# Patient Record
Sex: Female | Born: 1970
Health system: Southern US, Community
[De-identification: ages and names within clinical notes are randomized; demographics above are authoritative.]

## PROBLEM LIST (undated history)

## (undated) DIAGNOSIS — E079 Disorder of thyroid, unspecified: Secondary | ICD-10-CM

## (undated) HISTORY — PX: APPENDECTOMY: SHX54

## (undated) HISTORY — PX: MOUTH SURGERY: SHX715

## (undated) HISTORY — PX: TUBAL LIGATION: SHX77

---

## 2000-06-18 ENCOUNTER — Encounter: Payer: Self-pay | Admitting: Orthopaedic Surgery

## 2000-06-18 ENCOUNTER — Ambulatory Visit (HOSPITAL_COMMUNITY): Admission: RE | Admit: 2000-06-18 | Discharge: 2000-06-18 | Payer: Self-pay | Admitting: Orthopaedic Surgery

## 2002-11-23 ENCOUNTER — Emergency Department (HOSPITAL_COMMUNITY): Admission: EM | Admit: 2002-11-23 | Discharge: 2002-11-24 | Payer: Self-pay | Admitting: Emergency Medicine

## 2002-11-25 ENCOUNTER — Encounter: Payer: Self-pay | Admitting: Preventative Medicine

## 2002-11-25 ENCOUNTER — Ambulatory Visit (HOSPITAL_COMMUNITY): Admission: RE | Admit: 2002-11-25 | Discharge: 2002-11-25 | Payer: Self-pay | Admitting: Preventative Medicine

## 2011-04-21 ENCOUNTER — Encounter (HOSPITAL_COMMUNITY): Payer: Self-pay

## 2011-04-21 ENCOUNTER — Encounter (HOSPITAL_COMMUNITY): Payer: Self-pay | Admitting: *Deleted

## 2011-04-21 ENCOUNTER — Emergency Department (HOSPITAL_COMMUNITY): Payer: Self-pay

## 2011-04-21 ENCOUNTER — Encounter (HOSPITAL_COMMUNITY): Payer: Self-pay | Admitting: Anesthesiology

## 2011-04-21 ENCOUNTER — Encounter (HOSPITAL_COMMUNITY)
Admission: EM | Disposition: A | Discharge status: Home / Self Care | Payer: Self-pay | Source: Home / Self Care | Attending: Emergency Medicine

## 2011-04-21 ENCOUNTER — Emergency Department (HOSPITAL_COMMUNITY): Payer: Self-pay | Admitting: Anesthesiology

## 2011-04-21 ENCOUNTER — Observation Stay (HOSPITAL_COMMUNITY)
Admission: EM | Admit: 2011-04-21 | Discharge: 2011-04-22 | Disposition: A | Discharge status: Home / Self Care | Payer: Self-pay | Attending: Emergency Medicine | Admitting: Emergency Medicine

## 2011-04-21 DIAGNOSIS — K37 Unspecified appendicitis: Secondary | ICD-10-CM

## 2011-04-21 DIAGNOSIS — K3580 Unspecified acute appendicitis: Principal | ICD-10-CM | POA: Insufficient documentation

## 2011-04-21 HISTORY — DX: Disorder of thyroid, unspecified: E07.9

## 2011-04-21 HISTORY — PX: LAPAROSCOPIC APPENDECTOMY: SHX408

## 2011-04-21 LAB — DIFFERENTIAL
Basophils Absolute: 0 10*3/uL (ref 0.0–0.1)
Basophils Relative: 0 % (ref 0–1)
Eosinophils Absolute: 0.1 10*3/uL (ref 0.0–0.7)
Eosinophils Relative: 0 % (ref 0–5)
Lymphocytes Relative: 16 % (ref 12–46)
Lymphs Abs: 2.7 10*3/uL (ref 0.7–4.0)
Monocytes Absolute: 0.8 10*3/uL (ref 0.1–1.0)
Monocytes Relative: 5 % (ref 3–12)
Neutro Abs: 13.4 10*3/uL — ABNORMAL HIGH (ref 1.7–7.7)
Neutrophils Relative %: 79 % — ABNORMAL HIGH (ref 43–77)

## 2011-04-21 LAB — BASIC METABOLIC PANEL
BUN: 7 mg/dL (ref 6–23)
CO2: 28 mEq/L (ref 19–32)
Calcium: 9.5 mg/dL (ref 8.4–10.5)
Chloride: 101 mEq/L (ref 96–112)
Creatinine, Ser: 0.88 mg/dL (ref 0.50–1.10)
GFR calc Af Amer: >90 mL/min (ref >90)
GFR calc non Af Amer: 82 mL/min — ABNORMAL LOW (ref >90)
Glucose, Bld: 104 mg/dL — ABNORMAL HIGH (ref 70–99)
Potassium: 4.2 mEq/L (ref 3.5–5.1)
Sodium: 136 mEq/L (ref 135–145)

## 2011-04-21 LAB — URINALYSIS, ROUTINE W REFLEX MICROSCOPIC
Bilirubin Urine: NEGATIVE
Glucose, UA: NEGATIVE mg/dL
Ketones, ur: NEGATIVE mg/dL
Nitrite: POSITIVE — AB
Protein, ur: NEGATIVE mg/dL
Specific Gravity, Urine: 1.025 (ref 1.005–1.030)
Urobilinogen, UA: 1 mg/dL (ref 0.0–1.0)
pH: 6.5 (ref 5.0–8.0)

## 2011-04-21 LAB — CBC
HCT: 39 % (ref 36.0–46.0)
Hemoglobin: 12.9 g/dL (ref 12.0–15.0)
MCH: 27.2 pg (ref 26.0–34.0)
MCHC: 33.1 g/dL (ref 30.0–36.0)
MCV: 82.1 fL (ref 78.0–100.0)
Platelets: 379 10*3/uL (ref 150–400)
RBC: 4.75 MIL/uL (ref 3.87–5.11)
RDW: 14.9 % (ref 11.5–15.5)
WBC: 17 10*3/uL — ABNORMAL HIGH (ref 4.0–10.5)

## 2011-04-21 LAB — URINE MICROSCOPIC-ADD ON

## 2011-04-21 LAB — PREGNANCY, URINE: Preg Test, Ur: NEGATIVE

## 2011-04-21 SURGERY — APPENDECTOMY, LAPAROSCOPIC
Anesthesia: General | Wound class: Contaminated

## 2011-04-21 MED ORDER — ERTAPENEM SODIUM 1 G IJ SOLR: 1.0000 g | INTRAMUSCULAR | Status: DC

## 2011-04-21 MED ORDER — ONDANSETRON HCL 4 MG PO TABS: 4.0000 mg | ORAL_TABLET | Freq: Four times a day (QID) | ORAL | Status: DC | PRN

## 2011-04-21 MED ORDER — BUPIVACAINE HCL (PF) 0.5 % IJ SOLN
INTRAMUSCULAR | Status: DC | PRN
  Administered 2011-04-21: 10 mL

## 2011-04-21 MED ORDER — ONDANSETRON HCL 4 MG/2ML IJ SOLN
INTRAMUSCULAR | Status: DC | PRN
  Administered 2011-04-21: 4 mg via INTRAVENOUS

## 2011-04-21 MED ORDER — ENOXAPARIN SODIUM 80 MG/0.8ML ~~LOC~~ SOLN
40.0000 mg | Freq: Once | SUBCUTANEOUS | Status: AC
  Administered 2011-04-21: 40 mg via SUBCUTANEOUS
  Filled 2011-04-21: qty 0.8

## 2011-04-21 MED ORDER — METOCLOPRAMIDE HCL 5 MG/ML IJ SOLN
10.0000 mg | Freq: Once | INTRAMUSCULAR | Status: AC
  Administered 2011-04-21: 10 mg via INTRAVENOUS
  Filled 2011-04-21: qty 2

## 2011-04-21 MED ORDER — GLYCOPYRROLATE 0.2 MG/ML IJ SOLN
INTRAMUSCULAR | Status: DC | PRN
  Administered 2011-04-21: .6 mg via INTRAVENOUS

## 2011-04-21 MED ORDER — HYDROMORPHONE HCL PF 1 MG/ML IJ SOLN
1.0000 mg | Freq: Once | INTRAMUSCULAR | Status: AC
  Administered 2011-04-21: 1 mg via INTRAVENOUS
  Filled 2011-04-21: qty 1

## 2011-04-21 MED ORDER — IOHEXOL 300 MG/ML  SOLN
100.0000 mL | Freq: Once | INTRAMUSCULAR | Status: AC | PRN
  Administered 2011-04-21: 100 mL via INTRAVENOUS

## 2011-04-21 MED ORDER — ONDANSETRON HCL 4 MG/2ML IJ SOLN: 4.0000 mg | Freq: Four times a day (QID) | INTRAMUSCULAR | Status: DC | PRN

## 2011-04-21 MED ORDER — FENTANYL CITRATE 0.05 MG/ML IJ SOLN
50.0000 ug | Freq: Once | INTRAMUSCULAR | Status: AC
  Administered 2011-04-21: 50 ug via INTRAVENOUS
  Filled 2011-04-21: qty 2

## 2011-04-21 MED ORDER — FENTANYL CITRATE 0.05 MG/ML IJ SOLN
100.0000 ug | Freq: Once | INTRAMUSCULAR | Status: AC
  Administered 2011-04-21: 100 ug via INTRAVENOUS
  Filled 2011-04-21: qty 2

## 2011-04-21 MED ORDER — IOHEXOL 300 MG/ML  SOLN
40.0000 mL | Freq: Once | INTRAMUSCULAR | Status: AC | PRN
  Administered 2011-04-21: 40 mL via ORAL

## 2011-04-21 MED ORDER — MIDAZOLAM HCL 5 MG/5ML IJ SOLN
INTRAMUSCULAR | Status: DC | PRN
  Administered 2011-04-21: 2 mg via INTRAVENOUS

## 2011-04-21 MED ORDER — ONDANSETRON HCL 4 MG/2ML IJ SOLN
4.0000 mg | Freq: Once | INTRAMUSCULAR | Status: AC
  Administered 2011-04-21: 4 mg via INTRAVENOUS
  Filled 2011-04-21: qty 2

## 2011-04-21 MED ORDER — ONDANSETRON HCL 4 MG/2ML IJ SOLN: 4.0000 mg | Freq: Once | INTRAMUSCULAR | Status: AC | PRN

## 2011-04-21 MED ORDER — LACTATED RINGERS IV SOLN
INTRAVENOUS | Status: DC
  Administered 2011-04-22: 02:00:00 via INTRAVENOUS

## 2011-04-21 MED ORDER — PROPOFOL 10 MG/ML IV BOLUS
INTRAVENOUS | Status: DC | PRN
  Administered 2011-04-21: 150 mg via INTRAVENOUS

## 2011-04-21 MED ORDER — SODIUM CHLORIDE 0.9 % IR SOLN
Status: DC | PRN
  Administered 2011-04-21: 1000 mL

## 2011-04-21 MED ORDER — ENOXAPARIN SODIUM 40 MG/0.4ML ~~LOC~~ SOLN: 40.0000 mg | SUBCUTANEOUS | Status: DC

## 2011-04-21 MED ORDER — HYDROMORPHONE HCL PF 1 MG/ML IJ SOLN
1.0000 mg | INTRAMUSCULAR | Status: DC | PRN
  Administered 2011-04-21 – 2011-04-22 (×2): 1 mg via INTRAVENOUS
  Filled 2011-04-21 (×2): qty 1

## 2011-04-21 MED ORDER — ACETAMINOPHEN 10 MG/ML IV SOLN
1000.0000 mg | Freq: Once | INTRAVENOUS | Status: AC
  Administered 2011-04-21: 1000 mg via INTRAVENOUS

## 2011-04-21 MED ORDER — SODIUM CHLORIDE 0.9 % IV SOLN: INTRAVENOUS | Status: DC

## 2011-04-21 MED ORDER — LEVOTHYROXINE SODIUM 112 MCG PO TABS
112.0000 ug | ORAL_TABLET | Freq: Every day | ORAL | Status: DC
  Filled 2011-04-21 (×2): qty 1

## 2011-04-21 MED ORDER — LIDOCAINE HCL 1 % IJ SOLN
INTRAMUSCULAR | Status: DC | PRN
  Administered 2011-04-21: 40 mg via INTRADERMAL

## 2011-04-21 MED ORDER — NEOSTIGMINE METHYLSULFATE 1 MG/ML IJ SOLN
INTRAMUSCULAR | Status: DC | PRN
  Administered 2011-04-21: 3 mg via INTRAVENOUS

## 2011-04-21 MED ORDER — SUCCINYLCHOLINE CHLORIDE 20 MG/ML IJ SOLN
INTRAMUSCULAR | Status: DC | PRN
  Administered 2011-04-21: 120 mg via INTRAVENOUS

## 2011-04-21 MED ORDER — SODIUM CHLORIDE 0.9 % IV BOLUS (SEPSIS)
1000.0000 mL | Freq: Once | INTRAVENOUS | Status: AC
  Administered 2011-04-21: 1000 mL via INTRAVENOUS

## 2011-04-21 MED ORDER — ROCURONIUM BROMIDE 100 MG/10ML IV SOLN
INTRAVENOUS | Status: DC | PRN
  Administered 2011-04-21: 25 mg via INTRAVENOUS
  Administered 2011-04-21: 5 mg via INTRAVENOUS

## 2011-04-21 MED ORDER — FENTANYL CITRATE 0.05 MG/ML IJ SOLN
INTRAMUSCULAR | Status: DC | PRN
  Administered 2011-04-21 (×4): 50 ug via INTRAVENOUS

## 2011-04-21 MED ORDER — FENTANYL CITRATE 0.05 MG/ML IJ SOLN
1.0000 ug/kg | INTRAMUSCULAR | Status: DC | PRN
  Administered 2011-04-21 (×2): 50 ug via INTRAVENOUS

## 2011-04-21 MED ORDER — ACETAMINOPHEN 10 MG/ML IV SOLN
1000.0000 mg | Freq: Four times a day (QID) | INTRAVENOUS | Status: DC
  Administered 2011-04-22: 1000 mg via INTRAVENOUS
  Filled 2011-04-21 (×3): qty 100

## 2011-04-21 MED ORDER — LACTATED RINGERS IV SOLN
INTRAVENOUS | Status: DC | PRN
  Administered 2011-04-21: 20:00:00 via INTRAVENOUS

## 2011-04-21 MED ORDER — SODIUM CHLORIDE 0.9 % IV SOLN
1.0000 g | Freq: Once | INTRAVENOUS | Status: AC
  Administered 2011-04-21: 1 g via INTRAVENOUS
  Filled 2011-04-21: qty 1

## 2011-04-21 SURGICAL SUPPLY — 44 items
BAG HAMPER (MISCELLANEOUS) ×2 IMPLANT
CLOTH BEACON ORANGE TIMEOUT ST (SAFETY) ×2 IMPLANT
COVER LIGHT HANDLE STERIS (MISCELLANEOUS) ×4 IMPLANT
CUTTER ENDO LINEAR 45M (STAPLE) IMPLANT
CUTTER LINEAR ENDO 35 ETS (STAPLE) IMPLANT
CUTTER LINEAR ENDO 35 ETS TH (STAPLE) ×2 IMPLANT
DECANTER SPIKE VIAL GLASS SM (MISCELLANEOUS) ×2 IMPLANT
DISSECTOR BLUNT TIP ENDO 5MM (MISCELLANEOUS) IMPLANT
DURAPREP 26ML APPLICATOR (WOUND CARE) ×2 IMPLANT
ELECT REM PT RETURN 9FT ADLT (ELECTROSURGICAL) ×2
ELECTRODE REM PT RTRN 9FT ADLT (ELECTROSURGICAL) ×1 IMPLANT
FILTER SMOKE EVAC LAPAROSHD (FILTER) ×2 IMPLANT
FORMALIN 10 PREFIL 120ML (MISCELLANEOUS) ×2 IMPLANT
GLOVE BIO SURGEON STRL SZ7.5 (GLOVE) ×2 IMPLANT
GLOVE BIOGEL PI IND STRL 7.0 (GLOVE) ×1 IMPLANT
GLOVE BIOGEL PI INDICATOR 7.0 (GLOVE) ×1
GLOVE ECLIPSE 6.5 STRL STRAW (GLOVE) ×2 IMPLANT
GLOVE ECLIPSE 7.0 STRL STRAW (GLOVE) ×2 IMPLANT
GLOVE EXAM NITRILE MD LF STRL (GLOVE) ×2 IMPLANT
GOWN STRL REIN XL XLG (GOWN DISPOSABLE) ×4 IMPLANT
INST SET LAPROSCOPIC AP (KITS) ×2 IMPLANT
IV NS IRRIG 3000ML ARTHROMATIC (IV SOLUTION) ×2 IMPLANT
KIT ROOM TURNOVER APOR (KITS) ×2 IMPLANT
MANIFOLD NEPTUNE II (INSTRUMENTS) ×2 IMPLANT
NEEDLE INSUFFLATION 14GA 120MM (NEEDLE) ×2 IMPLANT
NS IRRIG 1000ML POUR BTL (IV SOLUTION) ×2 IMPLANT
PACK LAP CHOLE LZT030E (CUSTOM PROCEDURE TRAY) ×2 IMPLANT
PAD ARMBOARD 7.5X6 YLW CONV (MISCELLANEOUS) ×2 IMPLANT
POUCH SPECIMEN RETRIEVAL 10MM (ENDOMECHANICALS) ×2 IMPLANT
RELOAD /EVU35 (ENDOMECHANICALS) IMPLANT
RELOAD 45 VASCULAR/THIN (ENDOMECHANICALS) IMPLANT
RELOAD CUTTER ETS 35MM STAND (ENDOMECHANICALS) IMPLANT
SCALPEL HARMONIC ACE (MISCELLANEOUS) ×2 IMPLANT
SET BASIN LINEN APH (SET/KITS/TRAYS/PACK) ×2 IMPLANT
SET TUBE IRRIG SUCTION NO TIP (IRRIGATION / IRRIGATOR) ×2 IMPLANT
SPONGE GAUZE 2X2 8PLY STRL LF (GAUZE/BANDAGES/DRESSINGS) ×6 IMPLANT
STAPLER VISISTAT (STAPLE) ×2 IMPLANT
SUT VICRYL 0 UR6 27IN ABS (SUTURE) ×2 IMPLANT
TRAY FOLEY CATH 14FR (SET/KITS/TRAYS/PACK) ×2 IMPLANT
TROCAR Z-THAD FIOS HNDL 12X100 (TROCAR) ×2 IMPLANT
TROCAR Z-THRD FIOS HNDL 11X100 (TROCAR) ×2 IMPLANT
TROCAR Z-THREAD FIOS 5X100MM (TROCAR) ×2 IMPLANT
WARMER LAPAROSCOPE (MISCELLANEOUS) ×2 IMPLANT
YANKAUER SUCT BULB TIP 10FT TU (MISCELLANEOUS) ×2 IMPLANT

## 2011-04-21 NOTE — ED Provider Notes (Signed)
History     CSN: [id number]  Arrival date & time 04/21/11  3086   First MD Initiated Contact with Patient 04/21/11 1324      Chief Complaint  Patient presents with  . Abdominal Pain    (Consider location/radiation/quality/duration/timing/severity/associated sxs/prior treatment) HPI 41 year old female has a gradual onset constant over 24 hours of localized right lower quadrant pain with nausea and one episode of nonbloody vomiting with persistent nausea and anorexia since then, she only had a small amount to drink earlier today and it caused worse pain, she has not eaten solid foods since yesterday, she is no diarrhea no bloody stools no vaginal discharge no vaginal bleeding no dysuria. She is no fever cough chest pain or shortness of breath. Her pain is moderately severe and worse with position changes better she stays still. She gradual onset constant pain without treatment prior to arrival. Past Medical History  Diagnosis Date  . Thyroid disease     Past Surgical History  Procedure Date  . Mouth surgery   . Tubal ligation   . Appendectomy     History reviewed. No pertinent family history.  History  Substance Use Topics  . Smoking status: Former Games developer  . Smokeless tobacco: Not on file  . Alcohol Use: Yes     Occ    OB History    Grav Para Term Preterm Abortions TAB SAB Ect Mult Living                  Review of Systems  Constitutional: Negative for fever.       10 Systems reviewed and are negative for acute change except as noted in the HPI.  HENT: Negative for congestion.   Eyes: Negative for discharge and redness.  Respiratory: Negative for cough and shortness of breath.   Cardiovascular: Negative for chest pain.  Gastrointestinal: Positive for nausea, vomiting and abdominal pain. Negative for diarrhea.  Genitourinary: Negative for dysuria, flank pain, vaginal bleeding and vaginal discharge.  Musculoskeletal: Negative for back pain.  Skin: Negative for rash.   Neurological: Negative for syncope, numbness and headaches.  Psychiatric/Behavioral:       No behavior change.    Allergies  Kiwi extract; Tomato; Vicodin; and Pineapple  Home Medications   Current Outpatient Rx  Name Route Sig Dispense Refill  . AMOXICILLIN 500 MG PO CAPS Oral Take 500 mg by mouth 3 (three) times daily.    Marland Kitchen LEVOTHYROXINE SODIUM 112 MCG PO TABS Oral Take 112 mcg by mouth daily.    . OXYCODONE-ACETAMINOPHEN 7.5-325 MG PO TABS Oral Take 1-2 tablets by mouth every 4 (four) hours as needed for pain. 40 tablet 0    BP 107/67  Pulse 67  Temp(Src) 98.1 F (36.7 C) (Oral)  Resp 18  Ht 5\' 5"  (1.651 m)  Wt 188 lb 7.9 oz (85.5 kg)  BMI 31.37 kg/m2  SpO2 96%  LMP 04/14/2011  Physical Exam  Nursing note and vitals reviewed. Constitutional:       Awake, alert, nontoxic appearance.  HENT:  Head: Atraumatic.  Mouth/Throat: No oropharyngeal exudate.  Eyes: Pupils are equal, round, and reactive to light. Right eye exhibits no discharge. Left eye exhibits no discharge.  Neck: Neck supple.  Cardiovascular: Normal rate and regular rhythm.   No murmur heard. Pulmonary/Chest: Effort normal and breath sounds normal. No respiratory distress. She has no wheezes. She has no rales. She exhibits no tenderness.  Abdominal: Soft. Bowel sounds are normal. She exhibits no mass. There is  tenderness. There is rebound and guarding.       Moderate tenderness to the right lower quadrant with Percussion tenderness is present over the right lower quadrant with mild localized rebound in the right lower quadrant only without generalized peritonitis  Genitourinary:       A chaperone is present for a bimanual examination with no cervical motion tenderness no adnexal tenderness no adnexal masses palpated and no discharge or bleeding noted in the examination glove  Musculoskeletal: She exhibits no edema and no tenderness.       Baseline ROM, no obvious new focal weakness.  Neurological:        Mental status and motor strength appears baseline for patient and situation.  Skin: No rash noted.  Psychiatric: She has a normal mood and affect.    ED Course  Procedures (including critical care time) HCG pnd, CT ordered if HCG is neg. Patient / Family / Caregiver understand and agree with initial ED impression and plan with expectations set for ED visit.1340 Labs Reviewed  URINALYSIS, ROUTINE W REFLEX MICROSCOPIC - Abnormal; Notable for the following:    Hgb urine dipstick SMALL (*)    Nitrite POSITIVE (*)    Leukocytes, UA TRACE (*)    All other components within normal limits  BASIC METABOLIC PANEL - Abnormal; Notable for the following:    Glucose, Bld 104 (*)    GFR calc non Af Amer 82 (*)    All other components within normal limits  CBC - Abnormal; Notable for the following:    WBC 17.0 (*)    All other components within normal limits  DIFFERENTIAL - Abnormal; Notable for the following:    Neutrophils Relative 79 (*)    Neutro Abs 13.4 (*)    All other components within normal limits  URINE MICROSCOPIC-ADD ON - Abnormal; Notable for the following:    Bacteria, UA MANY (*)    All other components within normal limits  CBC - Abnormal; Notable for the following:    WBC 14.0 (*)    Hemoglobin 11.1 (*)    HCT 33.3 (*)    All other components within normal limits  PREGNANCY, URINE   Ct Abdomen Pelvis W Contrast  04/21/2011  *RADIOLOGY REPORT*  Clinical Data: Right lower quadrant pain for 2 days.  CT ABDOMEN AND PELVIS WITH CONTRAST  Technique:  Multidetector CT imaging of the abdomen and pelvis was performed following the standard protocol during bolus administration of intravenous contrast.  Contrast: OMNIPAQUE IOHEXOL 300 MG/ML IV SOLN, 40mL OMNIPAQUE IOHEXOL 300 MG/ML IV SOLN  Comparison: None.  Findings: Lung bases are clear except for minimal scarring at the left base.  No pleural or pericardial fluid.  The liver has a normal appearance without focal lesions or  biliary ductal dilatation.  No calcified gallstones.  The spleen is normal. The pancreas is normal.  The adrenal glands are normal.  The kidneys are normal.  No cyst, mass, stone or hydronephrosis.  The aorta and IVC are normal.  No retroperitoneal mass or adenopathy. No free intraperitoneal fluid or air in the upper abdominal portion of the scan.  There is acute appendicitis with a dilated fluid filled appendix. There is regional inflammatory change with a small amount of free peritoneal fluid in that region and in the pelvis.  The uterus and adnexal regions appear normal.  IMPRESSION: Acute appendicitis.  Regional inflammatory change.  Small amount of free fluid in the region and in the pelvis.  No evidence  of free air.  These results were called by telephone on 04/21/2011  at  1650 hours to  Dr. Fonnie Jarvis , who verbally acknowledged these results.  Original Report Authenticated By: Thomasenia Sales, M.D.     1. Appendicitis       MDM  Pt stable in ED with no significant deterioration in condition.Patient / Family / Caregiver informed of clinical course, understand medical decision-making process, and agree with plan.d/w surg for admit.        Hurman Horn, MD 04/22/11 2256

## 2011-04-21 NOTE — Anesthesia Preprocedure Evaluation (Addendum)
Anesthesia Evaluation  Patient identified by MRN, date of birth, ID band Patient awake    Reviewed: Allergy & Precautions, H&P , NPO status , Patient's Chart, lab work & pertinent test results  History of Anesthesia Complications Negative for: history of anesthetic complications  Airway Mallampati: I TM Distance: >3 FB Neck ROM: Full    Dental  (+) Teeth Intact and Dental Advisory Given   Pulmonary former smoker   Pulmonary exam normal       Cardiovascular neg cardio ROS Regular Normal    Neuro/Psych Negative Neurological ROS  Negative Psych ROS   GI/Hepatic   Endo/Other  Hypothyroidism   Renal/GU      Musculoskeletal   Abdominal   Peds  Hematology   Anesthesia Other Findings   Reproductive/Obstetrics                         Anesthesia Physical Anesthesia Plan  ASA: I and Emergent  Anesthesia Plan: General   Post-op Pain Management:    Induction: Intravenous, Rapid sequence and Cricoid pressure planned  Airway Management Planned: Oral ETT  Additional Equipment:   Intra-op Plan:   Post-operative Plan: Extubation in OR  Informed Consent: I have reviewed the patients History and Physical, chart, labs and discussed the procedure including the risks, benefits and alternatives for the proposed anesthesia with the patient or authorized representative who has indicated his/her understanding and acceptance.   Dental advisory given  Plan Discussed with: CRNA  Anesthesia Plan Comments: (ASA 1E as above for GOT/RSI,  Telephone consult with Dr. Jayme Cloud.)        Anesthesia Quick Evaluation

## 2011-04-21 NOTE — Anesthesia Procedure Notes (Signed)
Procedure Name: Intubation Date/Time: 04/21/2011 7:46 PM Performed by: Glynn Octave Pre-anesthesia Checklist: Patient identified, Patient being monitored, Timeout performed, Emergency Drugs available and Suction available Patient Re-evaluated:Patient Re-evaluated prior to inductionOxygen Delivery Method: Circle System Utilized Preoxygenation: Pre-oxygenation with 100% oxygen Intubation Type: IV induction, Rapid sequence and Cricoid Pressure applied Laryngoscope Size: Mac and 3 Grade View: Grade I Tube type: Oral Tube size: 7.0 mm Number of attempts: 1 Airway Equipment and Method: stylet Placement Confirmation: ETT inserted through vocal cords under direct vision,  positive ETCO2 and breath sounds checked- equal and bilateral Secured at: 21 cm Tube secured with: Tape Dental Injury: Teeth and Oropharynx as per pre-operative assessment

## 2011-04-21 NOTE — Op Note (Signed)
Patient:  Megan Stark  DOB:  [birth year]  MRN:  [id number]   Preop Diagnosis:  Acute appendicitis  Postop Diagnosis:  Same  Procedure:  Laparoscopic appendectomy  Surgeon:  Franky Macho, M.D.  Anes:  General endotracheal  Indications:  Patient is a 41 year old white female presents with a 24-hour history of worsening right lower quadrant abdominal pain. CT scan of the abdomen reveals acute appendicitis with possible early perforation. The risks and benefits of the procedure including bleeding, infection, and a possibly of an open procedure were fully explained to the patient, gave informed consent.  Procedure note:  Patient is placed the supine position. After induction of general endotracheal anesthesia, the abdomen was prepped and draped using usual sterile technique with DuraPrep. Surgical site confirmation was performed.  A supraumbilical incision was made down to the fascia. A Veress needle was introduced into the abdominal cavity and confirmation of placement was done using the saline drop test. The abdomen was then insufflated to 16 mm mercury pressure. An 11 mm trocar was introduced into the abdominal cavity under direct visualization the difficulty. The patient was placed in deeper Trendelenburg position and additional 12 mm trocar was placed the suprapubic region a 5 mm trocar was placed left lower quadrant region. The appendix was visualized and noted to be acutely inflamed. The mesoappendix was divided using the harmonic scalpel. A vascular Endo GIA was placed across the base the appendix and fired. The appendix was then removed from the operative field using an Endo Catch bag. Placenta pathology further examination. The staple line was inspected and noted within normal limits. There was some free fluid within the peritoneal cavity and pelvis, though it did not appear to be purulent in nature. It was suctioned out without difficulty. All fluid and air were then evacuated from the  abdominal cavity prior to removal of the trochars.  All wounds were gave normal saline. All wounds were injected with 0.5% Sensorcaine. The suprabuccal fashion as well suprapubic fascia reapproximated using 0 Vicryl interrupted sutures. All skin incisions were closed using staples. Betadine ointment dorsal dressings were applied.  All tape and needle counts were correct at the end of the procedure. Patient was extubated in the operating room and went back to recovery room awake in stable condition.  Complications:  None  EBL:  Minimal  Specimen:  Appendix

## 2011-04-21 NOTE — ED Notes (Signed)
RLQ abdominal pain began yesterday with nausea.

## 2011-04-21 NOTE — Anesthesia Postprocedure Evaluation (Signed)
  Anesthesia Post-op Note  Patient: Megan Stark  Procedure(s) Performed: Procedure(s) (LRB): APPENDECTOMY LAPAROSCOPIC (N/A)  Patient Location: PACU  Anesthesia Type: General  Level of Consciousness: awake and oriented  Airway and Oxygen Therapy: Patient Spontanous Breathing and Patient connected to face mask oxygen  Post-op Pain: mild  Post-op Assessment: Post-op Vital signs reviewed, Patient's Cardiovascular Status Stable, Respiratory Function Stable, Patent Airway and No signs of Nausea or vomiting  Post-op Vital Signs: Reviewed and stable  Complications: No apparent anesthesia complications

## 2011-04-21 NOTE — H&P (Signed)
Megan Stark is an 41 y.o. female.   Chief Complaint: Right lower quadrant abdominal pain HPI: Patient is a 41 year old white female presents with a 12 hour history of worsening right lower quadrant abdominal pain. CT scan the abdomen and pelvis reveals acute appendicitis. There is a question of early perforation present.  Past Medical History  Diagnosis Date  . Thyroid disease     Past Surgical History  Procedure Date  . Mouth surgery   . Tubal ligation     No family history on file. Social History:  reports that she has been smoking.  She does not have any smokeless tobacco history on file. She reports that she drinks alcohol. She reports that she does not use illicit drugs.  Allergies:  Allergies  Allergen Reactions  . Kiwi Extract Other (See Comments)    Cold Sore  . Tomato Other (See Comments)    Cold Sore  . Vicodin (Hydrocodone-Acetaminophen) Nausea And Vomiting    Medications Prior to Admission  Medication Dose Route Frequency Provider Last Rate Last Dose  . 0.9 %  sodium chloride infusion   Intravenous Continuous Hurman Horn, MD      . ertapenem [facility]) 1 g in sodium chloride 0.9 % 50 mL IVPB  1 g Intravenous Once Hurman Horn, MD   1 g at 04/21/11 1722  . fentaNYL (SUBLIMAZE) injection 100 mcg  100 mcg Intravenous Once Hurman Horn, MD   100 mcg at 04/21/11 1404  . fentaNYL (SUBLIMAZE) injection 50 mcg  50 mcg Intravenous Once Hurman Horn, MD   50 mcg at 04/21/11 1220  . HYDROmorphone (DILAUDID) injection 1 mg  1 mg Intravenous Once Hurman Horn, MD   1 mg at 04/21/11 1715  . iohexol (OMNIPAQUE) 300 MG/ML solution 100 mL  100 mL Intravenous Once PRN Medication Radiologist, MD   100 mL at 04/21/11 1625  . iohexol (OMNIPAQUE) 300 MG/ML solution 40 mL  40 mL Oral Once PRN Medication Radiologist, MD   40 mL at 04/21/11 1505  . metoCLOPramide (REGLAN) injection 10 mg  10 mg Intravenous Once Hurman Horn, MD   10 mg at 04/21/11 1715  . ondansetron (ZOFRAN)  injection 4 mg  4 mg Intravenous Once Hurman Horn, MD   4 mg at 04/21/11 1220  . ondansetron (ZOFRAN) injection 4 mg  4 mg Intravenous Once Hurman Horn, MD   4 mg at 04/21/11 1405  . sodium chloride 0.9 % bolus 1,000 mL  1,000 mL Intravenous Once Hurman Horn, MD   1,000 mL at 04/21/11 1405   No current outpatient prescriptions on file as of 04/21/2011.    Results for orders placed during the hospital encounter of 04/21/11 (from the past 48 hour(s))  URINALYSIS, ROUTINE W REFLEX MICROSCOPIC     Status: Abnormal   Collection Time   04/21/11 11:02 AM      Component Value Range Comment   Color, Urine YELLOW  YELLOW     APPearance CLEAR  CLEAR     Specific Gravity, Urine 1.025  1.005 - 1.030     pH 6.5  5.0 - 8.0     Glucose, UA NEGATIVE  NEGATIVE (mg/dL)    Hgb urine dipstick SMALL (*) NEGATIVE     Bilirubin Urine NEGATIVE  NEGATIVE     Ketones, ur NEGATIVE  NEGATIVE (mg/dL)    Protein, ur NEGATIVE  NEGATIVE (mg/dL)    Urobilinogen, UA 1.0  0.0 - 1.0 (mg/dL)  Nitrite POSITIVE (*) NEGATIVE     Leukocytes, UA TRACE (*) NEGATIVE    URINE MICROSCOPIC-ADD ON     Status: Abnormal   Collection Time   04/21/11 11:02 AM      Component Value Range Comment   WBC, UA 3-6  <3 (WBC/hpf)    RBC / HPF 3-6  <3 (RBC/hpf)    Bacteria, UA MANY (*) RARE    BASIC METABOLIC PANEL     Status: Abnormal   Collection Time   04/21/11 11:11 AM      Component Value Range Comment   Sodium 136  135 - 145 (mEq/L)    Potassium 4.2  3.5 - 5.1 (mEq/L)    Chloride 101  96 - 112 (mEq/L)    CO2 28  19 - 32 (mEq/L)    Glucose, Bld 104 (*) 70 - 99 (mg/dL)    BUN 7  6 - 23 (mg/dL)    Creatinine, Ser 1.61  0.50 - 1.10 (mg/dL)    Calcium 9.5  8.4 - 10.5 (mg/dL)    GFR calc non Af Amer 82 (*) >90 (mL/min)    GFR calc Af Amer >90  >90 (mL/min)   CBC     Status: Abnormal   Collection Time   04/21/11 11:11 AM      Component Value Range Comment   WBC 17.0 (*) 4.0 - 10.5 (K/uL)    RBC 4.75  3.87 - 5.11 (MIL/uL)     Hemoglobin 12.9  12.0 - 15.0 (g/dL)    HCT 09.6  04.5 - 40.9 (%)    MCV 82.1  78.0 - 100.0 (fL)    MCH 27.2  26.0 - 34.0 (pg)    MCHC 33.1  30.0 - 36.0 (g/dL)    RDW 81.1  91.4 - 78.2 (%)    Platelets 379  150 - 400 (K/uL)   DIFFERENTIAL     Status: Abnormal   Collection Time   04/21/11 11:11 AM      Component Value Range Comment   Neutrophils Relative 79 (*) 43 - 77 (%)    Neutro Abs 13.4 (*) 1.7 - 7.7 (K/uL)    Lymphocytes Relative 16  12 - 46 (%)    Lymphs Abs 2.7  0.7 - 4.0 (K/uL)    Monocytes Relative 5  3 - 12 (%)    Monocytes Absolute 0.8  0.1 - 1.0 (K/uL)    Eosinophils Relative 0  0 - 5 (%)    Eosinophils Absolute 0.1  0.0 - 0.7 (K/uL)    Basophils Relative 0  0 - 1 (%)    Basophils Absolute 0.0  0.0 - 0.1 (K/uL)   PREGNANCY, URINE     Status: Normal   Collection Time   04/21/11  2:10 PM      Component Value Range Comment   Preg Test, Ur NEGATIVE  NEGATIVE     Ct Abdomen Pelvis W Contrast  04/21/2011  *RADIOLOGY REPORT*  Clinical Data: Right lower quadrant pain for 2 days.  CT ABDOMEN AND PELVIS WITH CONTRAST  Technique:  Multidetector CT imaging of the abdomen and pelvis was performed following the standard protocol during bolus administration of intravenous contrast.  Contrast: OMNIPAQUE IOHEXOL 300 MG/ML IV SOLN, 40mL OMNIPAQUE IOHEXOL 300 MG/ML IV SOLN  Comparison: None.  Findings: Lung bases are clear except for minimal scarring at the left base.  No pleural or pericardial fluid.  The liver has a normal appearance without focal lesions or biliary ductal dilatation.  No calcified gallstones.  The spleen is normal. The pancreas is normal.  The adrenal glands are normal.  The kidneys are normal.  No cyst, mass, stone or hydronephrosis.  The aorta and IVC are normal.  No retroperitoneal mass or adenopathy. No free intraperitoneal fluid or air in the upper abdominal portion of the scan.  There is acute appendicitis with a dilated fluid filled appendix. There is regional  inflammatory change with a small amount of free peritoneal fluid in that region and in the pelvis.  The uterus and adnexal regions appear normal.  IMPRESSION: Acute appendicitis.  Regional inflammatory change.  Small amount of free fluid in the region and in the pelvis.  No evidence of free air.  These results were called by telephone on 04/21/2011  at  1650 hours to  Dr. Fonnie Jarvis , who verbally acknowledged these results.  Original Report Authenticated By: Thomasenia Sales, M.D.    Review of Systems  Constitutional: Positive for fever and malaise/fatigue.  HENT: Negative.   Eyes: Negative.   Respiratory: Negative.   Cardiovascular: Negative.   Gastrointestinal: Positive for abdominal pain.       Right lower quadrant.  Genitourinary: Negative.   Musculoskeletal: Negative.   Skin: Negative.   Neurological: Negative.   Endo/Heme/Allergies: Negative.   Psychiatric/Behavioral: Negative.     Blood pressure 118/66, pulse 65, temperature 98 F (36.7 C), temperature source Oral, resp. rate 22, height 5\' 5"  (1.651 m), weight 81.194 kg (179 lb), last menstrual period 04/14/2011, SpO2 100.00%. Physical Exam  Constitutional: She is oriented to person, place, and time. She appears well-developed and well-nourished.  HENT:  Head: Normocephalic and atraumatic.  Eyes: Pupils are equal, round, and reactive to light.  Neck: Normal range of motion. Neck supple.  Cardiovascular: Normal rate, regular rhythm and normal heart sounds.   Respiratory: Effort normal and breath sounds normal.  GI: Soft. Bowel sounds are normal. There is tenderness. There is rebound.       Tender over McBurney's point.  No rigidity noted.  Neurological: She is alert and oriented to person, place, and time.  Skin: Skin is warm and dry.  Psychiatric: She has a normal mood and affect.     Assessment/Plan Impression: Acute appendicitis Plan: Patient be taken to the operating room for laparoscopic appendectomy. The risks and  benefits of the procedure including bleeding, infection, and a possibly of an open procedure were fully explained to the patient, gave informed consent.  Thad Osoria A 04/21/2011, 6:31 PM

## 2011-04-21 NOTE — Transfer of Care (Signed)
Immediate Anesthesia Transfer of Care Note  Patient: Megan Stark  Procedure(s) Performed: Procedure(s) (LRB): APPENDECTOMY LAPAROSCOPIC (N/A)  Patient Location: PACU  Anesthesia Type: General  Level of Consciousness: awake, alert  and oriented  Airway & Oxygen Therapy: Patient Spontanous Breathing and Patient connected to face mask oxygen  Post-op Assessment: Report given to PACU RN  Post vital signs: Reviewed and stable  Complications: No apparent anesthesia complications

## 2011-04-22 ENCOUNTER — Encounter (HOSPITAL_COMMUNITY): Payer: Self-pay | Admitting: Anesthesiology

## 2011-04-22 LAB — CBC
HCT: 33.3 % — ABNORMAL LOW (ref 36.0–46.0)
MCH: 27.4 pg (ref 26.0–34.0)
MCV: 82.2 fL (ref 78.0–100.0)
RDW: 15 % (ref 11.5–15.5)
WBC: 14 10*3/uL — ABNORMAL HIGH (ref 4.0–10.5)

## 2011-04-22 MED ORDER — OXYCODONE-ACETAMINOPHEN 7.5-325 MG PO TABS: 1.0000 | ORAL_TABLET | ORAL | Status: AC | PRN

## 2011-04-22 MED ORDER — ACETAMINOPHEN 10 MG/ML IV SOLN
INTRAVENOUS | Status: AC
  Filled 2011-04-22: qty 100

## 2011-04-22 NOTE — Plan of Care (Signed)
Problem: Phase I Progression Outcomes Goal: Pain controlled with appropriate interventions Outcome: Progressing Pt mostly complaining of sharp pain with movement, minimal to no pain when resting. Dilaudid seems to be effective.

## 2011-04-22 NOTE — Progress Notes (Signed)
UR Chart Review Completed  

## 2011-04-22 NOTE — Discharge Summary (Signed)
Physician Discharge Summary  Patient ID: Megan Stark MRN: [id number] DOB/AGE: 1970-10-15 41 y.o.  Admit date: 04/21/2011 Discharge date: 04/22/2011  Admission Diagnoses: Acute appendicitis  Discharge Diagnoses: Acute appendicitis Active Problems:  * No active hospital problems. *    Discharged Condition: good  Hospital Course: Patient is a 41 year old white female presented emergency room yesterday with worsening right lower corner abdominal pain. CT scan the abdomen revealed acute appendicitis. Surgery was consulted and the patient was taken to the operating room and underwent a laparoscopic appendectomy. Tolerated procedure well. Her postoperative course has been unremarkable. Her leukocytosis is resolving.  She is being discharged home in good improving condition.  Treatments: surgery: Laparoscopic appendectomy on 04/21/2011  Discharge Exam: Blood pressure 108/63, pulse 77, temperature 98.4 F (36.9 C), temperature source Oral, resp. rate 18, height 5\' 5"  (1.651 m), weight 85.5 kg (188 lb 7.9 oz), last menstrual period 04/14/2011, SpO2 95.00%. General appearance: alert and cooperative Resp: clear to auscultation bilaterally Cardio: regular rate and rhythm, S1, S2 normal, no murmur, click, rub or gallop GI:  soft, flat.  Incisions healing well.  Disposition: Home  Medication List  As of 04/22/2011  9:43 AM   STOP taking these medications         oxyCODONE-acetaminophen 5-325 MG per tablet         TAKE these medications         amoxicillin 500 MG capsule   Commonly known as: AMOXIL   Take 500 mg by mouth 3 (three) times daily.      levothyroxine 112 MCG tablet   Commonly known as: SYNTHROID, LEVOTHROID   Take 112 mcg by mouth daily.      oxyCODONE-acetaminophen 7.5-325 MG per tablet   Commonly known as: PERCOCET   Take 1-2 tablets by mouth every 4 (four) hours as needed for pain.           Follow-up Information    Follow up with Dalia Heading, MD.  Schedule an appointment as soon as possible for a visit on 04/29/2011.   Contact information:   [address] [place] [address] [phone]          Signed: Dalia Heading 04/22/2011, 9:43 AM

## 2011-04-22 NOTE — Discharge Instructions (Signed)
Laparoscopic Appendectomy Appendectomy is surgery to remove the appendix. Laparoscopic surgery uses several small cuts (incisions) instead of one large incision. Laparoscopic surgery offers a shorter recovery time and less discomfort. LET YOUR CAREGIVER KNOW ABOUT:  Allergies to food or medicine.   Medicines taken, including vitamins, dietary supplements, herbs, eyedrops, over-the-counter medicines, and creams.   Use of steroids (by mouth or creams).   Previous problems with anesthetics or numbing medicines.   History of bleeding problems or blood clots.   Previous surgery.   Other health problems, including diabetes, heart problems, lung problems, and kidney problems.   Possibility of pregnancy, if this applies.  RISKS AND COMPLICATIONS  Infection. A germ starts growing in the wound. This can usually be treated with antibiotics. In some cases, the wound will need to be opened and cleaned.   Bleeding.   Damage to other organs.   Sores (abscesses).   Chronic pain at the incision sites. This is defined as pain that lasts for more than 3 months.   Blood clots in the legs that may rarely travel to the lungs.   Infection in the lungs (pneumonia).  BEFORE THE PROCEDURE Appendectomy is usually performed immediately after an inflamed appendix (appendicitis) is diagnosed. No preparation is necessary ahead of this procedure. PROCEDURE  You will be given medicine that makes you sleep (general anesthetic). After you are asleep, a flexible tube (catheter) may be inserted into your bladder to drain your urine during surgery. The tube is removed before you wake up after surgery. When you are asleep, carbondioxide gas will be used to inflate your abdomen. This will allow your surgeon to see inside your abdomen and perform your surgery. Three small incisions will be made in your abdomen. Your surgeon will insert a thin, lighted tube (laparoscope) through one of the incisions. Your surgeon will  look through the laparoscope while performing the surgery. Other tools will be inserted through the other incisions. Laparoscopic procedures may not be appropriate when:  There is major scarring from a previous surgery.   The patient has bleeding disorders.   A pregnancy is near term.   There are other conditions which make the laparoscopic procedure impossible, such as an advanced infection or a ruptured appendix.  If your surgeon feels it is not safe to continue with the laparoscopic procedure, he or she will perform an open surgery instead. This gives the surgeon a larger view and more space to work. Open surgery requires a longer recovery time. After your appendix is removed, your incisions will be closed with stitches (sutures) or skin adhesive. AFTER THE PROCEDURE You will be taken to a recovery room. When the anesthesia has worn off, you will be returned to your hospital room. You will be given pain medicines to keep you comfortable. Ask your caregiver how long your hospital stay will be. Document Released: 09/25/2003 Document Revised: 10/23/2010 Document Reviewed: 08/20/2010 Exit[employer] Patient Information 2012 [employer]. 

## 2011-04-22 NOTE — Anesthesia Postprocedure Evaluation (Signed)
  Anesthesia Post-op Note  Patient: Megan Stark  Procedure(s) Performed: Procedure(s) (LRB): APPENDECTOMY LAPAROSCOPIC (N/A)  Patient Location: room 318  Anesthesia Type: General  Level of Consciousness: awake, alert , oriented and patient cooperative  Airway and Oxygen Therapy: Patient Spontanous Breathing  Post-op Pain: 3 /10, mild  Post-op Assessment: Post-op Vital signs reviewed, Patient's Cardiovascular Status Stable, Respiratory Function Stable, Patent Airway, No signs of Nausea or vomiting, Adequate PO intake and Pain level controlled  Post-op Vital Signs: Reviewed and stable  Complications: No apparent anesthesia complications

## 2011-04-22 NOTE — Plan of Care (Signed)
Problem: Phase I Progression Outcomes Goal: Incision/dressings dry and intact Outcome: Completed/Met Date Met:  04/22/11 Dressings are clean, dry and intact at this time.

## 2011-04-23 ENCOUNTER — Encounter (HOSPITAL_COMMUNITY): Payer: Self-pay | Admitting: General Surgery

## 2015-04-29 ENCOUNTER — Emergency Department (HOSPITAL_COMMUNITY)
Admission: EM | Admit: 2015-04-29 | Discharge: 2015-04-29 | Disposition: A | Discharge status: Home / Self Care | Payer: No Typology Code available for payment source | Attending: Emergency Medicine | Admitting: Emergency Medicine

## 2015-04-29 ENCOUNTER — Encounter (HOSPITAL_COMMUNITY): Payer: Self-pay

## 2015-04-29 DIAGNOSIS — M545 Low back pain, unspecified: Secondary | ICD-10-CM

## 2015-04-29 DIAGNOSIS — E079 Disorder of thyroid, unspecified: Secondary | ICD-10-CM | POA: Insufficient documentation

## 2015-04-29 DIAGNOSIS — Y9389 Activity, other specified: Secondary | ICD-10-CM | POA: Diagnosis not present

## 2015-04-29 DIAGNOSIS — S6991XA Unspecified injury of right wrist, hand and finger(s), initial encounter: Secondary | ICD-10-CM | POA: Diagnosis not present

## 2015-04-29 DIAGNOSIS — F17210 Nicotine dependence, cigarettes, uncomplicated: Secondary | ICD-10-CM | POA: Diagnosis not present

## 2015-04-29 DIAGNOSIS — M25531 Pain in right wrist: Secondary | ICD-10-CM

## 2015-04-29 DIAGNOSIS — S3992XA Unspecified injury of lower back, initial encounter: Secondary | ICD-10-CM | POA: Insufficient documentation

## 2015-04-29 DIAGNOSIS — Z79899 Other long term (current) drug therapy: Secondary | ICD-10-CM | POA: Insufficient documentation

## 2015-04-29 DIAGNOSIS — Y92410 Unspecified street and highway as the place of occurrence of the external cause: Secondary | ICD-10-CM | POA: Diagnosis not present

## 2015-04-29 DIAGNOSIS — Y998 Other external cause status: Secondary | ICD-10-CM | POA: Insufficient documentation

## 2015-04-29 DIAGNOSIS — Z792 Long term (current) use of antibiotics: Secondary | ICD-10-CM | POA: Diagnosis not present

## 2015-04-29 MED ORDER — METHOCARBAMOL 500 MG PO TABS: 500.0000 mg | ORAL_TABLET | Freq: Two times a day (BID) | ORAL | Status: DC | PRN

## 2015-04-29 MED ORDER — NAPROXEN 500 MG PO TABS
500.0000 mg | ORAL_TABLET | Freq: Two times a day (BID) | ORAL | Status: DC
Start: 2015-04-29 — End: 2022-07-10
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
## 2018-09-16 ENCOUNTER — Other Ambulatory Visit (HOSPITAL_COMMUNITY): Payer: Self-pay | Admitting: Family Medicine

## 2018-09-16 DIAGNOSIS — Z1231 Encounter for screening mammogram for malignant neoplasm of breast: Secondary | ICD-10-CM

## 2018-09-30 ENCOUNTER — Ambulatory Visit (HOSPITAL_COMMUNITY)
Admission: RE | Admit: 2018-09-30 | Discharge: 2018-09-30 | Disposition: A | Discharge status: Home / Self Care | Payer: Managed Care, Other (non HMO) | Source: Ambulatory Visit | Attending: Family Medicine | Admitting: Family Medicine

## 2018-09-30 ENCOUNTER — Other Ambulatory Visit: Payer: Self-pay

## 2018-09-30 DIAGNOSIS — Z1231 Encounter for screening mammogram for malignant neoplasm of breast: Secondary | ICD-10-CM | POA: Diagnosis present

## 2018-10-04 ENCOUNTER — Other Ambulatory Visit (HOSPITAL_COMMUNITY): Payer: Self-pay | Admitting: Family Medicine

## 2018-10-04 DIAGNOSIS — R928 Other abnormal and inconclusive findings on diagnostic imaging of breast: Secondary | ICD-10-CM

## 2018-10-12 ENCOUNTER — Encounter (HOSPITAL_COMMUNITY): Payer: Self-pay

## 2018-10-12 ENCOUNTER — Ambulatory Visit (HOSPITAL_COMMUNITY)
Admission: RE | Admit: 2018-10-12 | Discharge: 2018-10-12 | Disposition: A | Discharge status: Home / Self Care | Payer: Managed Care, Other (non HMO) | Source: Ambulatory Visit | Attending: Family Medicine | Admitting: Family Medicine

## 2018-10-12 ENCOUNTER — Encounter (HOSPITAL_COMMUNITY): Payer: Managed Care, Other (non HMO)

## 2018-10-12 ENCOUNTER — Other Ambulatory Visit: Payer: Self-pay

## 2018-10-12 DIAGNOSIS — R928 Other abnormal and inconclusive findings on diagnostic imaging of breast: Secondary | ICD-10-CM | POA: Insufficient documentation

## 2019-12-05 IMAGING — MG DIGITAL SCREENING BILATERAL MAMMOGRAM WITH TOMO AND CAD
6 of 10 series · 6 of 30 positions shown · non-contrast
Comparison: None

CLINICAL DATA: Screening.

EXAM:
DIGITAL SCREENING BILATERAL MAMMOGRAM WITH TOMO AND CAD

[L MLO synth-2D]
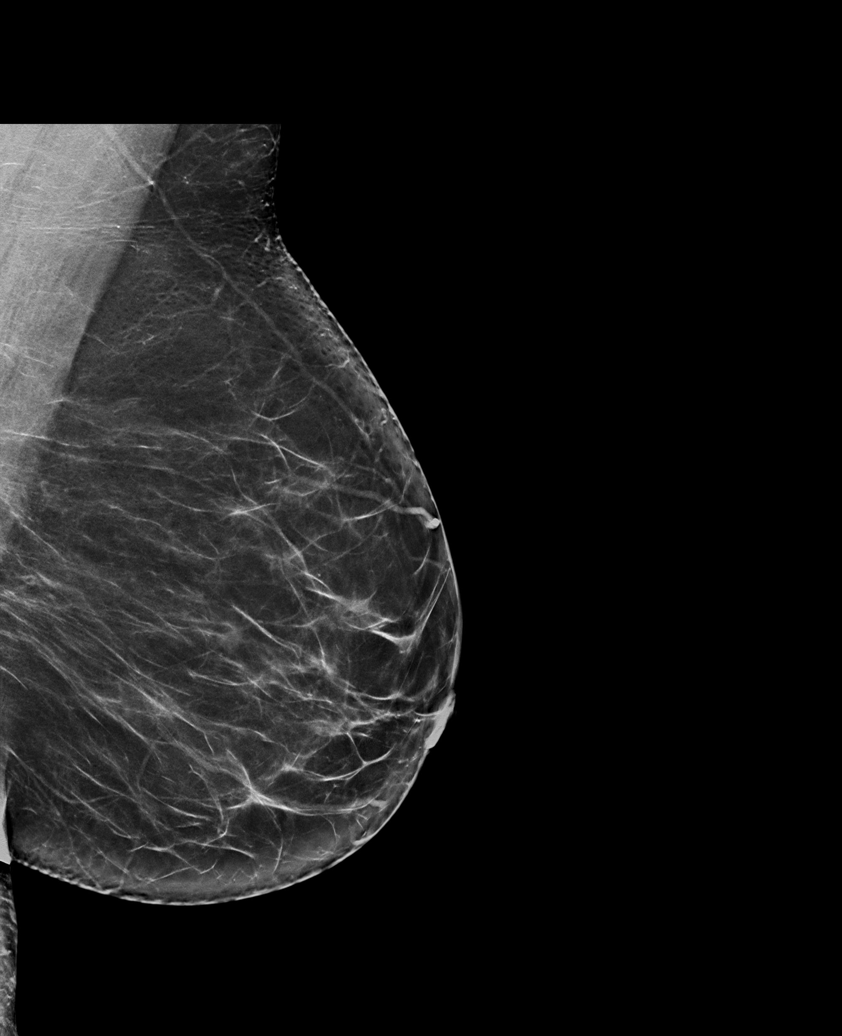

[R CC synth-2D]
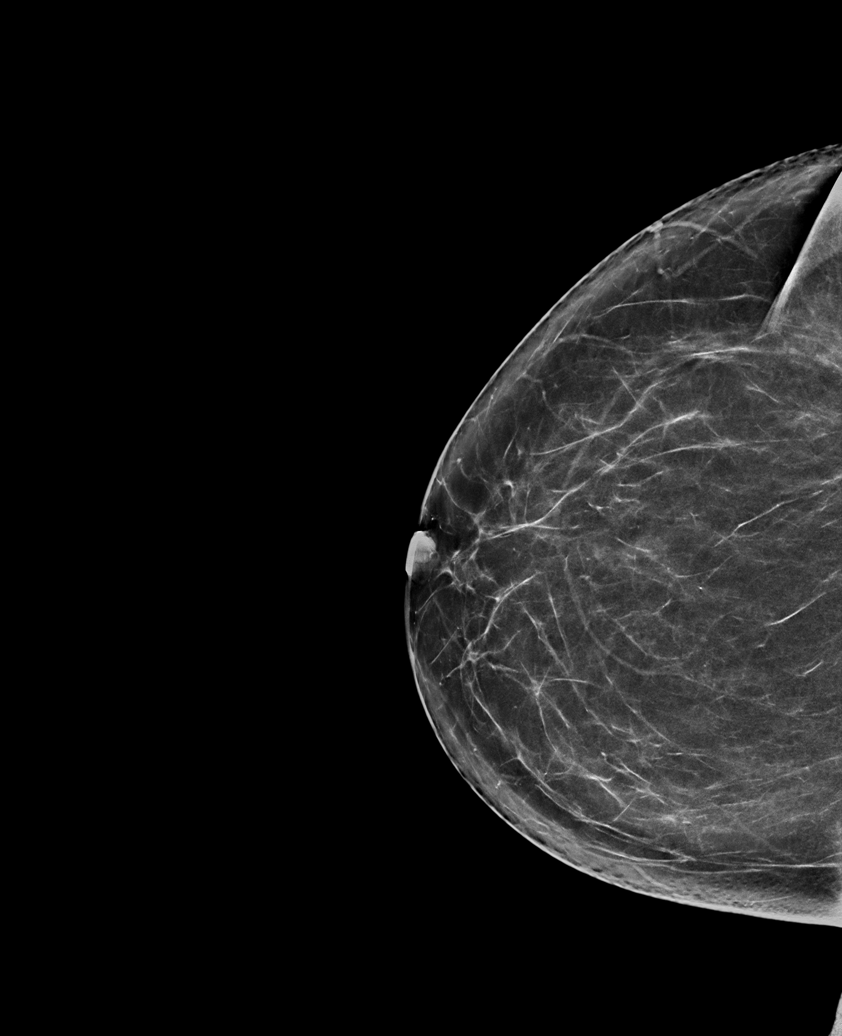

[R XCCM synth-2D]
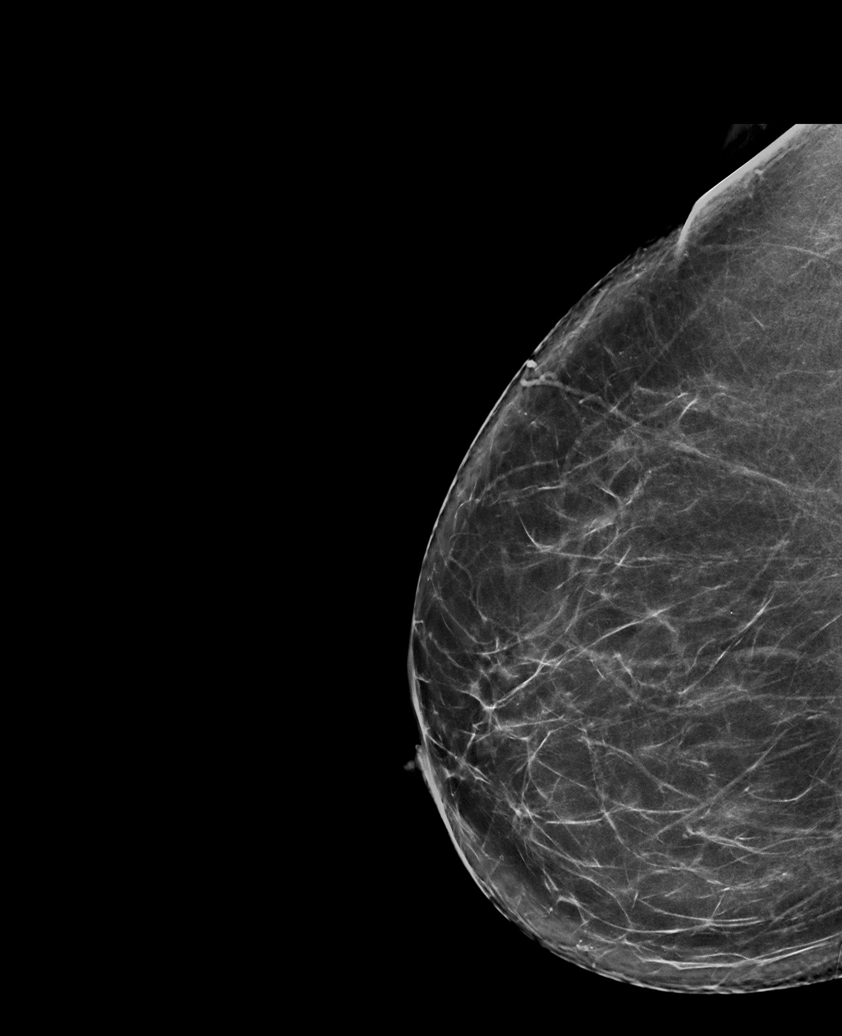

[L CC synth-2D]
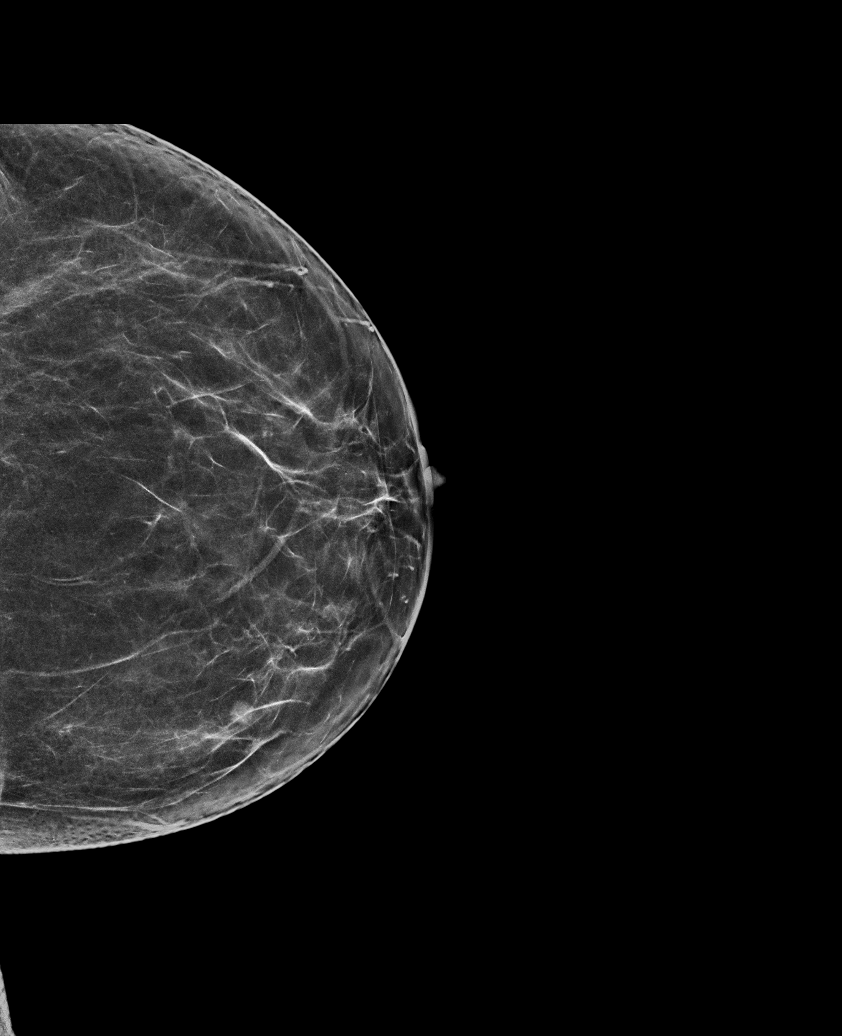

[R MLO synth-2D]
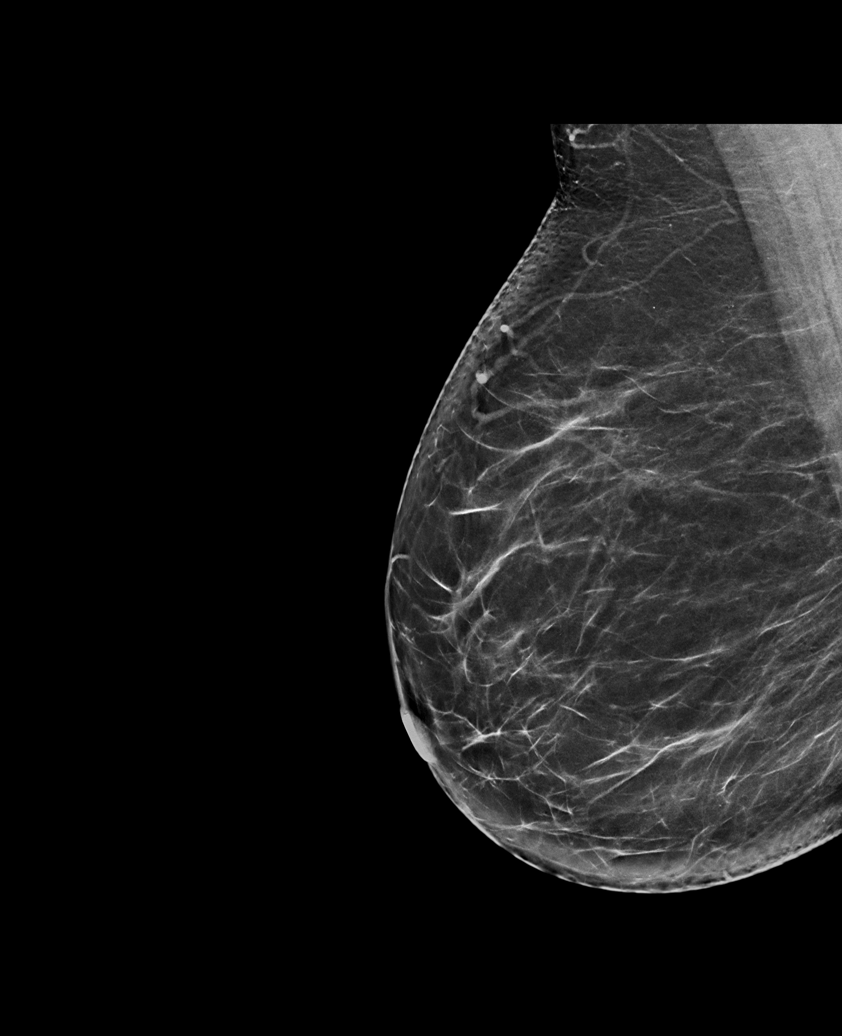

[R XCCM BREAST TOMOSYNTHESIS IMAGE tomo · tomo slice 41/80.0]
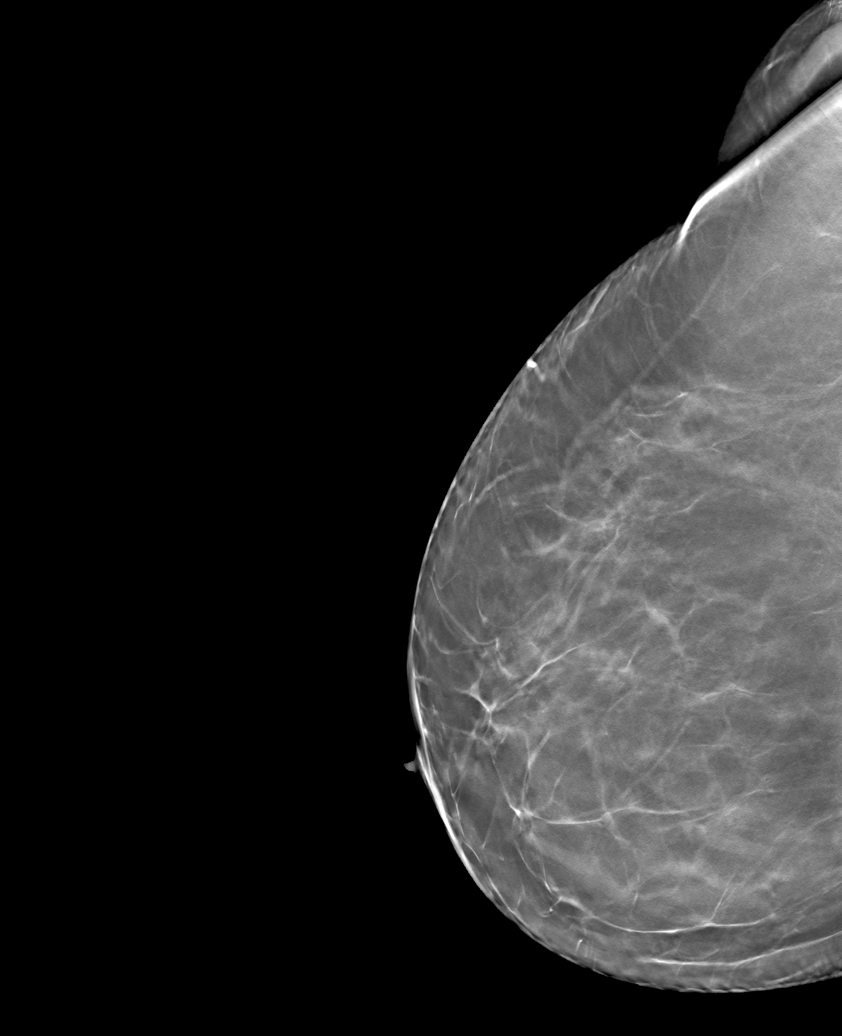

[6 of 30 positions shown; findings below may reference images not displayed]

ACR Breast Density Category b: There are scattered areas of
fibroglandular density.
FINDINGS: In the left breast, a possible mass warrants further evaluation. In
the right breast, no findings suspicious for malignancy.

Images were processed with CAD.
IMPRESSION: Further evaluation is suggested for possible mass in the left
breast.

RECOMMENDATION:
Diagnostic mammogram and possibly ultrasound of the left breast.
(Code:[id number])

The patient will be contacted regarding the findings, and additional
imaging will be scheduled.

BI-RADS CATEGORY  0: Incomplete. Need additional imaging evaluation
and/or prior mammograms for comparison.

## 2019-12-17 IMAGING — US ULTRASOUND LEFT BREAST LIMITED
1 series · 7 of 7 positions shown · non-contrast
Comparison: Baseline screening mammogram 09/30/2018

CLINICAL DATA: Screening recall for a possible left breast mass.

EXAM:
ULTRASOUND OF THE LEFT BREAST

[Series 1: ultrasound left breast limited · 0.07mm/px · 7 of 7 slices shown]
[im 1/7]
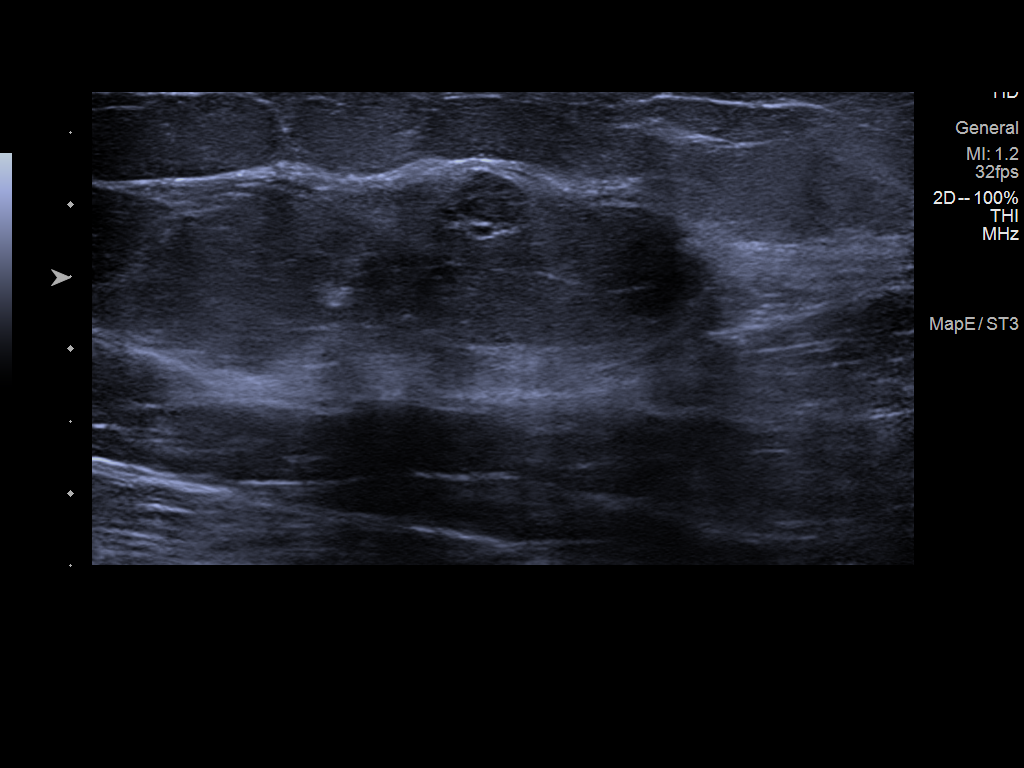
[im 2/7]
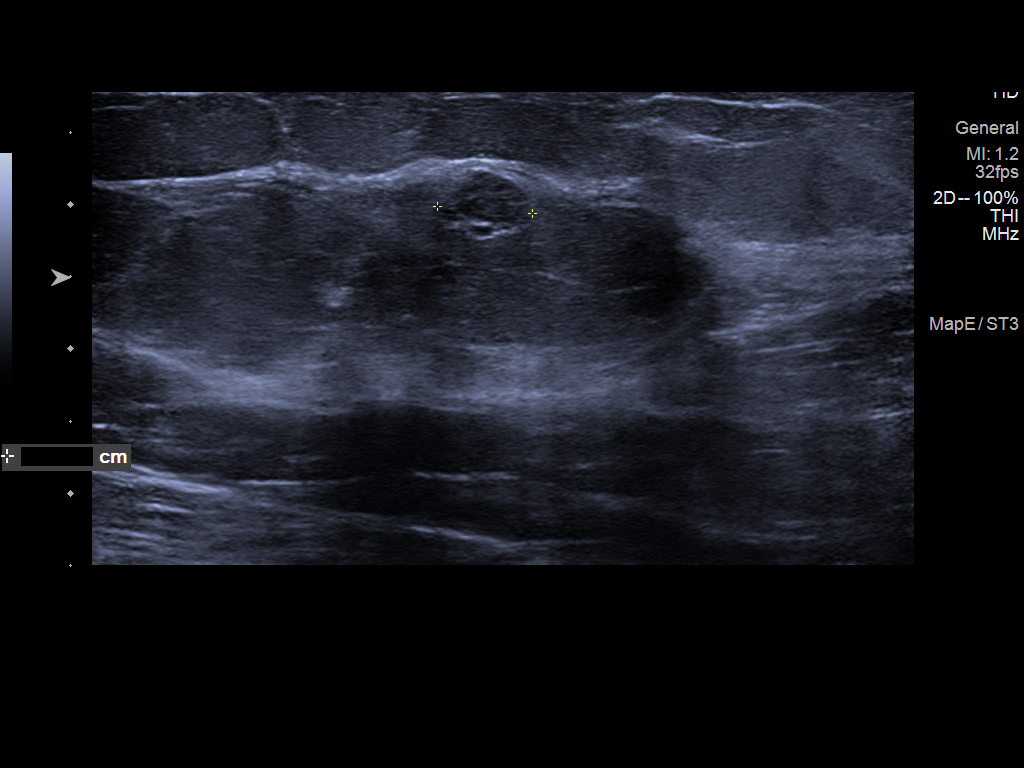
[im 3/7]
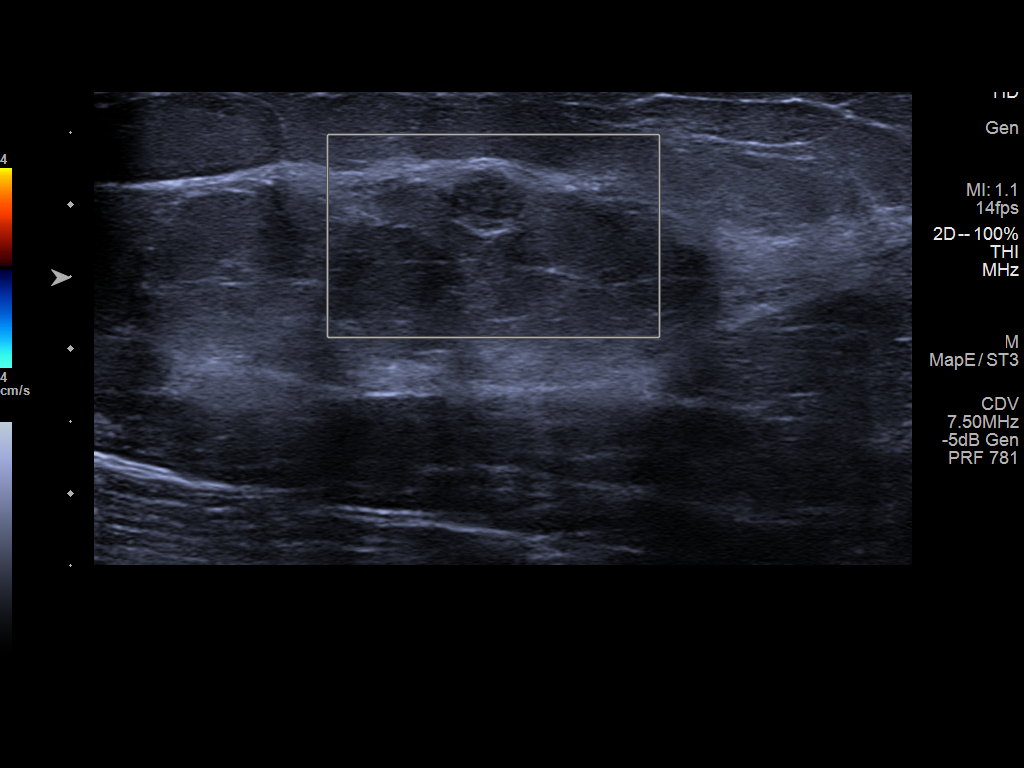
[im 4/7]
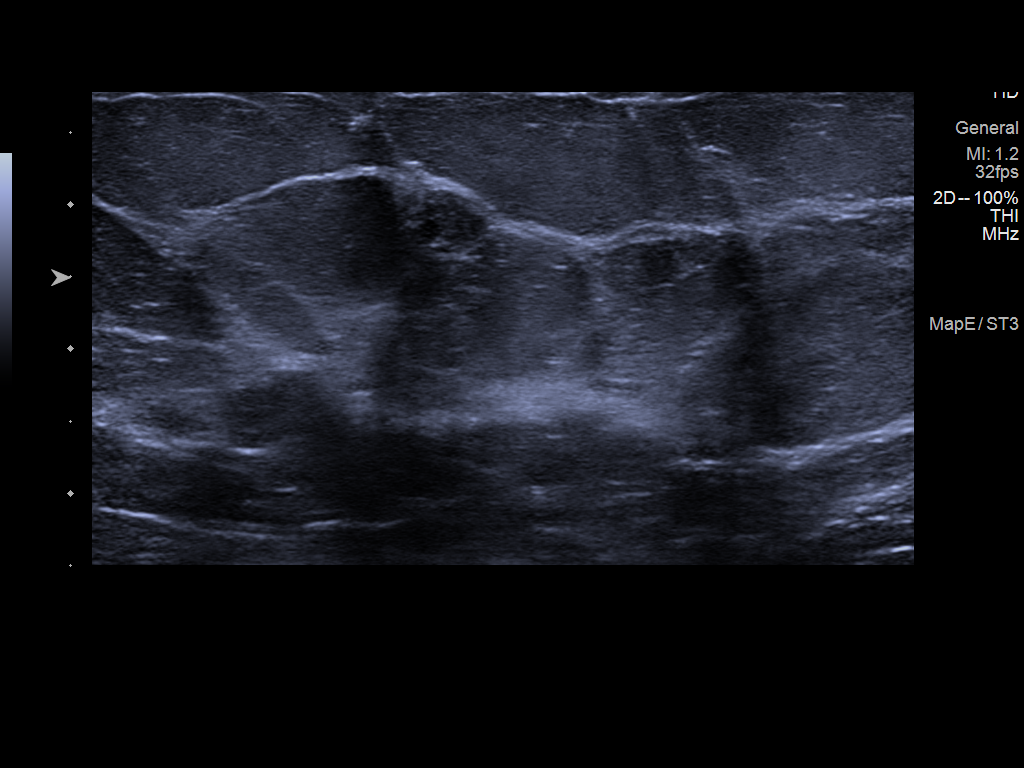
[im 5/7]
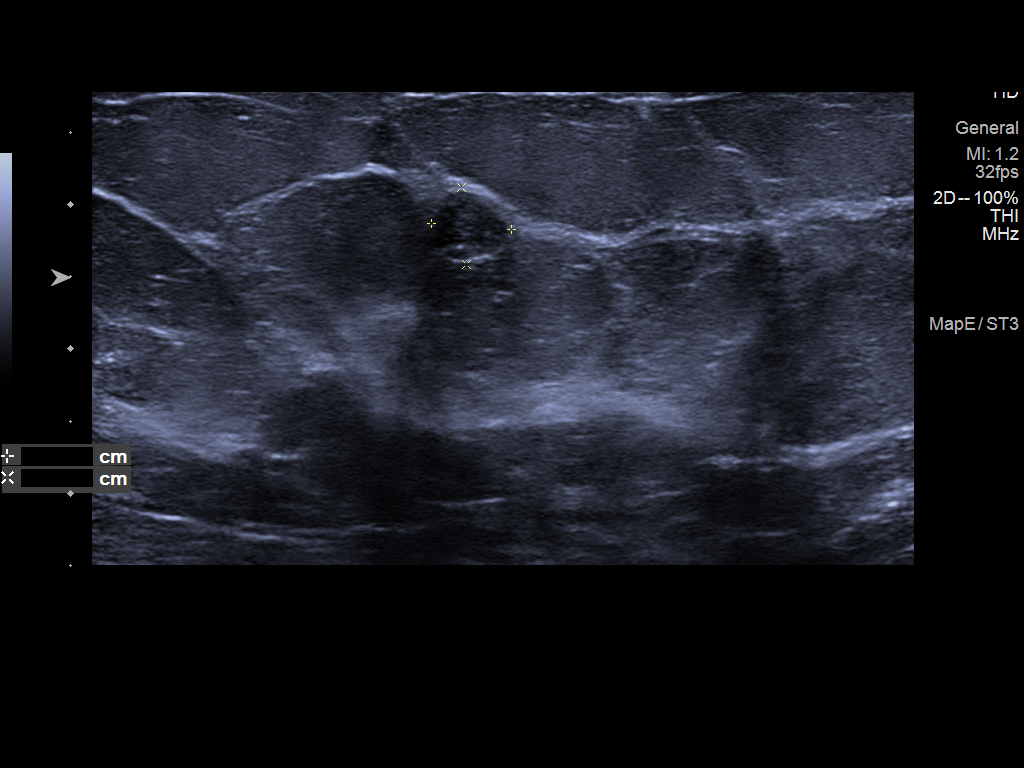
[im 6/7]
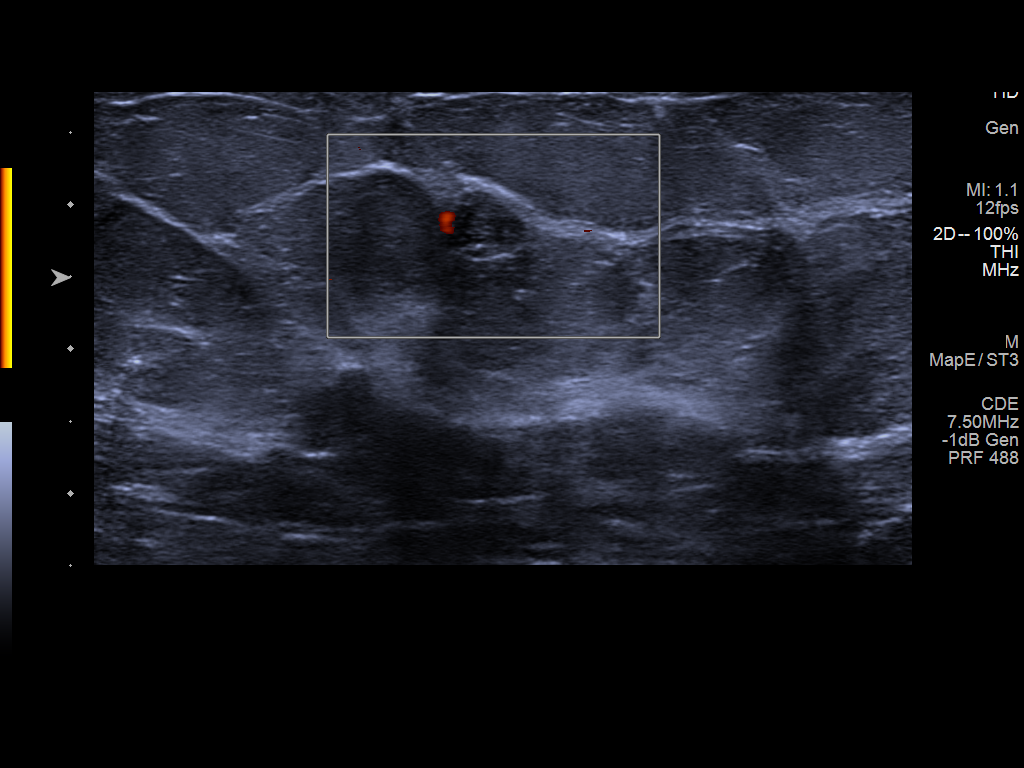
[im 7/7]
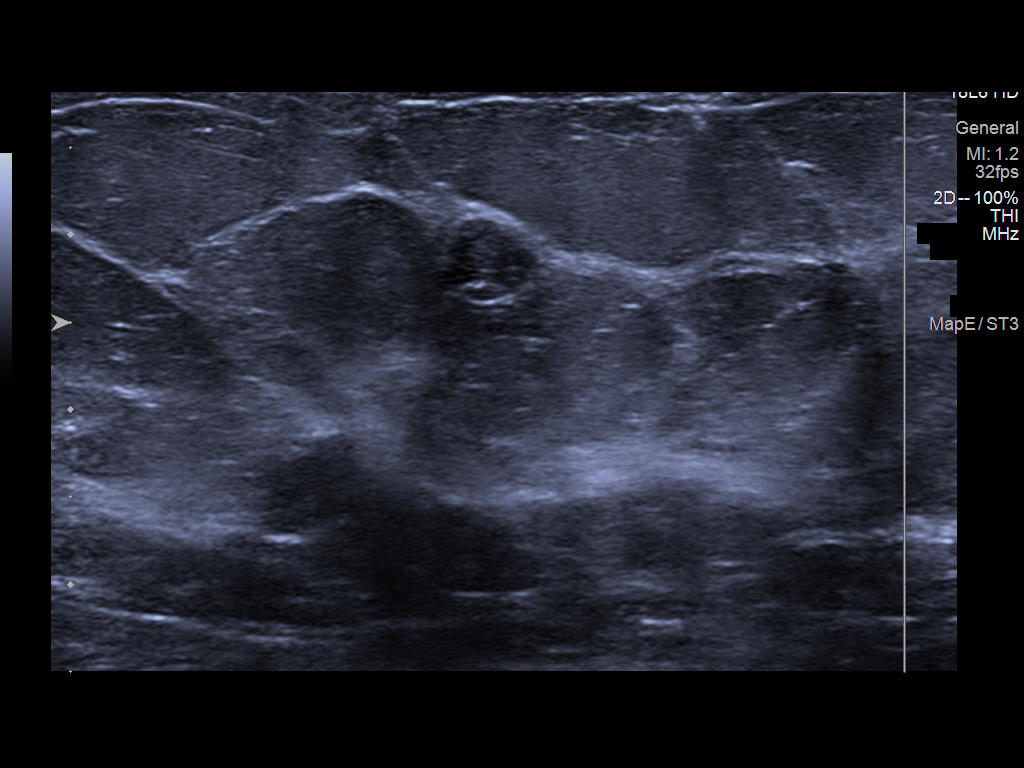

[7 of 7 positions shown; findings below may reference images not displayed]

FINDINGS: Ultrasound of the left breast at 10 o'clock, 8 cm from the nipple
demonstrates a circumscribed oval hypoechoic mass measuring 7 x 5 x
6 mm. No blood flow seen within the mass on color Doppler imaging.
IMPRESSION: There is a likely benign mass in the left breast at 10 o'clock. This
is favored to represent a fibroadenoma, though a papilloma or
complicated cyst can have a similar appearance.

RECOMMENDATION:
Six-month follow-up breast ultrasound.

I have discussed the findings and recommendations with the patient.
Results were also provided in writing at the conclusion of the
visit. If applicable, a reminder letter will be sent to the patient
regarding the next appointment.

BI-RADS CATEGORY  3: Probably benign.

## 2020-11-19 ENCOUNTER — Telehealth: Payer: BC Managed Care – PPO | Admitting: Physician Assistant

## 2020-11-19 DIAGNOSIS — H66001 Acute suppurative otitis media without spontaneous rupture of ear drum, right ear: Secondary | ICD-10-CM

## 2020-11-19 MED ORDER — AMOXICILLIN 500 MG PO CAPS: 500.0000 mg | ORAL_CAPSULE | Freq: Two times a day (BID) | ORAL | 0 refills | Status: AC

## 2020-11-19 NOTE — Patient Instructions (Signed)
Megan Stark, thank you for joining Margaretann Loveless, PA-C for today's virtual visit.  While this provider is not your primary care provider (PCP), if your PCP is located in our provider database this encounter information will be shared with them immediately following your visit.  Consent: (Patient) Megan Stark provided verbal consent for this virtual visit at the beginning of the encounter.  Current Medications:  Current Outpatient Medications:    amoxicillin (AMOXIL) 500 MG capsule, Take 1 capsule (500 mg total) by mouth 2 (two) times daily for 10 days., Disp: 20 capsule, Rfl: 0   levothyroxine (SYNTHROID, LEVOTHROID) 112 MCG tablet, Take 112 mcg by mouth daily., Disp: , Rfl:    methocarbamol (ROBAXIN) 500 MG tablet, Take 1 tablet (500 mg total) by mouth 2 (two) times daily as needed for muscle spasms., Disp: 5 tablet, Rfl: 0   naproxen (NAPROSYN) 500 MG tablet, Take 1 tablet (500 mg total) by mouth 2 (two) times daily., Disp: 30 tablet, Rfl: 0   Medications ordered in this encounter:  Meds ordered this encounter  Medications   amoxicillin (AMOXIL) 500 MG capsule    Sig: Take 1 capsule (500 mg total) by mouth 2 (two) times daily for 10 days.    Dispense:  20 capsule    Refill:  0    Order Specific Question:   Supervising Provider    Answer:   Hyacinth Meeker, BRIAN [[id number]]     *If you need refills on other medications prior to your next appointment, please contact your pharmacy*  Follow-Up: Call back or seek an in-person evaluation if the symptoms worsen or if the condition fails to improve as anticipated.  Other Instructions Otitis Media, Adult Otitis media is a condition in which the middle ear is red and swollen (inflamed) and full of fluid. The middle ear is the part of the ear that contains bones for hearing as well as air that helps send sounds to the brain. The condition usually goes away on its own. What are the causes? This condition is caused by a blockage in the  eustachian tube. This tube connects the middle ear to the back of the nose. It normally allows air into the middle ear. The blockage is caused by fluid or swelling. Problems that can cause blockage include: A cold or infection that affects the nose, mouth, or throat. Allergies. An irritant, such as tobacco smoke. Adenoids that have become large. The adenoids are soft tissue located in the back of the throat, behind the nose and the roof of the mouth. Growth or swelling in the upper part of the throat, just behind the nose (nasopharynx). Damage to the ear caused by a change in pressure. This is called barotrauma. What increases the risk? You are more likely to develop this condition if you: Smoke or are exposed to tobacco smoke. Have an opening in the roof of your mouth (cleft palate). Have acid reflux. Have problems in your body's defense system (immune system). What are the signs or symptoms? Symptoms of this condition include: Ear pain. Fever. Problems with hearing. Being tired. Fluid leaking from the ear. Ringing in the ear. How is this treated? This condition can go away on its own within 3-5 days. But if the condition is caused by germs (bacteria) and does not go away on its own, or if it keeps coming back, your doctor may: Give you antibiotic medicines. Give you medicines for pain. Follow these instructions at home: Take over-the-counter and prescription medicines only  as told by your doctor. If you were prescribed an antibiotic medicine, take it as told by your doctor. Do not stop taking it even if you start to feel better. Keep all follow-up visits. Contact a doctor if: You have bleeding from your nose. There is a lump on your neck. You are not feeling better in 5 days. You feel worse instead of better. Get help right away if: You have pain that is not helped with medicine. You have swelling, redness, or pain around your ear. You get a stiff neck. You cannot move part of  your face (paralysis). You notice that the bone behind your ear hurts when you touch it. You get a very bad headache. Summary Otitis media means that the middle ear is red, swollen, and full of fluid. This condition usually goes away on its own. If the problem does not go away, treatment may be needed. You may be given medicines to treat the infection or to treat your pain. If you were prescribed an antibiotic medicine, take it as told by your doctor. Do not stop taking it even if you start to feel better. Keep all follow-up visits. This information is not intended to replace advice given to you by your health care provider. Make sure you discuss any questions you have with your health care provider. Document Revised: 05/21/2020 Document Reviewed: 05/21/2020 Elsevier Patient Education  2022 [employer].    If you have been instructed to have an in-person evaluation today at a local Urgent Care facility, please use the link below. It will take you to a list of all of our available [facility], including address, phone number and hours of operation. Please do not delay care.  [facility]  If you or a family member do not have a primary care provider, use the link below to schedule a visit and establish care. When you choose a [facility] primary care physician or advanced practice provider, you gain a long-term partner in health. Find a Primary Care Provider  Learn more about [facility]'s in-office and virtual care options: [facility] - Get Care Now

## 2020-11-19 NOTE — Progress Notes (Signed)
Virtual Visit Consent   Megan Stark, you are scheduled for a virtual visit with a [facility] provider today.     Just as with appointments in the office, your consent must be obtained to participate.  Your consent will be active for this visit and any virtual visit you may have with one of our providers in the next 365 days.     If you have a MyChart account, a copy of this consent can be sent to you electronically.  All virtual visits are billed to your insurance company just like a traditional visit in the office.    As this is a virtual visit, video technology does not allow for your provider to perform a traditional examination.  This may limit your provider's ability to fully assess your condition.  If your provider identifies any concerns that need to be evaluated in person or the need to arrange testing (such as labs, EKG, etc.), we will make arrangements to do so.     Although advances in technology are sophisticated, we cannot ensure that it will always work on either your end or our end.  If the connection with a video visit is poor, the visit may have to be switched to a telephone visit.  With either a video or telephone visit, we are not always able to ensure that we have a secure connection.     I need to obtain your verbal consent now.   Are you willing to proceed with your visit today?    LASEAN RAHMING has provided verbal consent on 11/19/2020 for a virtual visit (video or telephone).   Margaretann Loveless, PA-C   Date: 11/19/2020 10:40 AM   Virtual Visit via Video Note   I, Margaretann Loveless, connected with  Megan Stark  ([id number], May 01, 1970) on 11/19/20 at 10:30 AM EDT by a video-enabled telemedicine application and verified that I am speaking with the correct person using two identifiers.  Location: Patient: Virtual Visit Location Patient: Home Provider: Virtual Visit Location Provider: Home Office   I discussed the limitations of evaluation and  management by telemedicine and the availability of in person appointments. The patient expressed understanding and agreed to proceed.    History of Present Illness: Megan Stark is a 50 y.o. who identifies as a female who was assigned female at birth, and is being seen today for covid 19 and possible ear infection.  HPI: Otalgia  There is pain in the right ear. This is a new problem. The current episode started 1 to 4 weeks ago (patient was diagnosed with COvid 19 over 10 days ago; still having residual ear pain and fatigue. All other symptoms improved). The problem has been gradually worsening. There has been no fever. The pain is mild. Associated symptoms comments: Fatigue. She has tried acetaminophen and NSAIDs for the symptoms. The treatment provided no relief.     Problems: There are no problems to display for this patient.   Allergies:  Allergies  Allergen Reactions   Tomato Other (See Comments)    Cold Sore   Vicodin [Hydrocodone-Acetaminophen] Nausea And Vomiting   Medications:  Current Outpatient Medications:    amoxicillin (AMOXIL) 500 MG capsule, Take 1 capsule (500 mg total) by mouth 2 (two) times daily for 10 days., Disp: 20 capsule, Rfl: 0   levothyroxine (SYNTHROID, LEVOTHROID) 112 MCG tablet, Take 112 mcg by mouth daily., Disp: , Rfl:    methocarbamol (ROBAXIN) 500 MG tablet, Take 1 tablet (500  mg total) by mouth 2 (two) times daily as needed for muscle spasms., Disp: 5 tablet, Rfl: 0   naproxen (NAPROSYN) 500 MG tablet, Take 1 tablet (500 mg total) by mouth 2 (two) times daily., Disp: 30 tablet, Rfl: 0  Observations/Objective: Patient is well-developed, well-nourished in no acute distress.  Resting comfortably at home.  Head is normocephalic, atraumatic.  No labored breathing.  Speech is clear and coherent with logical content.  Patient is alert and oriented at baseline.    Assessment and Plan: 1. Non-recurrent acute suppurative otitis media of right ear  without spontaneous rupture of tympanic membrane - amoxicillin (AMOXIL) 500 MG capsule; Take 1 capsule (500 mg total) by mouth 2 (two) times daily for 10 days.  Dispense: 20 capsule; Refill: 0  - Suspect secondary infection following covid 19 - Will treat with amoxicillin as noted above - Push fluids and rest as needed - Seek in person evaluation if symptoms persist  Follow Up Instructions: I discussed the assessment and treatment plan with the patient. The patient was provided an opportunity to ask questions and all were answered. The patient agreed with the plan and demonstrated an understanding of the instructions.  A copy of instructions were sent to the patient via MyChart unless otherwise noted below.    The patient was advised to call back or seek an in-person evaluation if the symptoms worsen or if the condition fails to improve as anticipated.  Time:  I spent 12 minutes with the patient via telehealth technology discussing the above problems/concerns.    Margaretann Loveless, PA-C

## 2021-07-15 ENCOUNTER — Other Ambulatory Visit: Payer: Self-pay | Admitting: *Deleted

## 2021-07-15 DIAGNOSIS — I8393 Asymptomatic varicose veins of bilateral lower extremities: Secondary | ICD-10-CM

## 2021-07-16 ENCOUNTER — Encounter: Payer: Self-pay | Admitting: Vascular Surgery

## 2021-07-16 ENCOUNTER — Ambulatory Visit: Payer: No Typology Code available for payment source | Admitting: Vascular Surgery

## 2021-07-16 ENCOUNTER — Ambulatory Visit (HOSPITAL_COMMUNITY)
Admission: RE | Admit: 2021-07-16 | Discharge: 2021-07-16 | Disposition: A | Discharge status: Home / Self Care | Payer: No Typology Code available for payment source | Source: Ambulatory Visit | Attending: Vascular Surgery | Admitting: Vascular Surgery

## 2021-07-16 DIAGNOSIS — I872 Venous insufficiency (chronic) (peripheral): Secondary | ICD-10-CM | POA: Insufficient documentation

## 2021-07-16 DIAGNOSIS — I8393 Asymptomatic varicose veins of bilateral lower extremities: Secondary | ICD-10-CM | POA: Insufficient documentation

## 2021-07-16 NOTE — Progress Notes (Signed)
Patient name: Megan Stark MRN: [id number] DOB: [birth year] Sex: female  REASON FOR CONSULT: Leg edema with varicose veins  HPI: Megan Stark is a 51 y.o. female, with history of hypothyroidism that presents for evaluation of bilateral lower extremity leg swelling with varicosities.  She states both legs have been swelling for at least the last 3 to 6 months.  She has more swelling in the right leg with notable varicosities.  No history of DVT.  No trauma.  She does work at a desk job.  She been wearing knee-high compression stockings 15 to 20 mmHg sized at elastic therapy.  Swelling is the most bothersome to her.  Past Medical History:  Diagnosis Date   Thyroid disease     Past Surgical History:  Procedure Laterality Date   APPENDECTOMY     LAPAROSCOPIC APPENDECTOMY  04/21/2011   Procedure: APPENDECTOMY LAPAROSCOPIC;  Surgeon: Jamesetta So, MD;  Location: [facility];  Service: General;  Laterality: N/A;   MOUTH SURGERY     TUBAL LIGATION      History reviewed. No pertinent family history.  SOCIAL HISTORY: Social History   Socioeconomic History   Marital status: Legally Separated    Spouse name: Not on file   Number of children: Not on file   Years of education: Not on file   Highest education level: Not on file  Occupational History   Not on file  Tobacco Use   Smoking status: Every Day    Packs/day: 0.50    Types: Cigarettes   Smokeless tobacco: Not on file  Substance and Sexual Activity   Alcohol use: Yes    Comment: Occ   Drug use: No   Sexual activity: Not on file  Other Topics Concern   Not on file  Social History Narrative   Not on file   Social Determinants of Health   Financial Resource Strain: Not on file  Food Insecurity: Not on file  Transportation Needs: Not on file  Physical Activity: Not on file  Stress: Not on file  Social Connections: Not on file  Intimate Partner Violence: Not on file    Allergies  Allergen Reactions   Tomato  Other (See Comments)    Cold Sore   Vicodin [Hydrocodone-Acetaminophen] Nausea And Vomiting    Current Outpatient Medications  Medication Sig Dispense Refill   levothyroxine (SYNTHROID, LEVOTHROID) 112 MCG tablet Take 112 mcg by mouth daily.     methocarbamol (ROBAXIN) 500 MG tablet Take 1 tablet (500 mg total) by mouth 2 (two) times daily as needed for muscle spasms. 5 tablet 0   naproxen (NAPROSYN) 500 MG tablet Take 1 tablet (500 mg total) by mouth 2 (two) times daily. (Patient not taking: Reported on 07/16/2021) 30 tablet 0   No current facility-administered medications for this visit.    REVIEW OF SYSTEMS:  [X]  denotes positive finding, [ ]  denotes negative finding Cardiac  Comments:  Chest pain or chest pressure:    Shortness of breath upon exertion:    Short of breath when lying flat:    Irregular heart rhythm:        Vascular    Pain in calf, thigh, or hip brought on by ambulation:    Pain in feet at night that wakes you up from your sleep:     Blood clot in your veins:    Leg swelling:  x       Pulmonary    Oxygen at home:  Productive cough:     Wheezing:         Neurologic    Sudden weakness in arms or legs:     Sudden numbness in arms or legs:     Sudden onset of difficulty speaking or slurred speech:    Temporary loss of vision in one eye:     Problems with dizziness:         Gastrointestinal    Blood in stool:     Vomited blood:         Genitourinary    Burning when urinating:     Blood in urine:        Psychiatric    Major depression:         Hematologic    Bleeding problems:    Problems with blood clotting too easily:        Skin    Rashes or ulcers:        Constitutional    Fever or chills:      PHYSICAL EXAM: Vitals:   07/16/21 0844  BP: (!) 141/85  Pulse: (!) 57  Resp: 14  Temp: 97.9 F (36.6 C)  TempSrc: Temporal  SpO2: 96%  Weight: 230 lb (104.3 kg)  Height: 5\' 6"  (1.676 m)    GENERAL: The patient is a well-nourished  female, in no acute distress. The vital signs are documented above. CARDIAC: There is a regular rate and rhythm.  VASCULAR:  Bilateral DP pulses palpable Prominent varicosity in the right distal thigh proximal calf from anterior accessory saphenous PULMONARY: No respiratory distress ABDOMEN: Soft and non-tender. MUSCULOSKELETAL: There are no major deformities or cyanosis. NEUROLOGIC: No focal weakness or paresthesias are detected. SKIN: There are no ulcers or rashes noted. PSYCHIATRIC: The patient has a normal affect.  DATA:    Lower Venous Reflux Study   Patient Name:  Megan Stark  Date of Exam:   07/16/2021  Medical Rec #: [id number]          Accession #:    [id number]  Date of Birth: 23-Mar-1970          Patient Gender: F  Patient Age:   34 years  Exam Location:  [facility]  Procedure:      VAS [place] LOWER EXTREMITY VENOUS REFLUX  Referring Phys: Monica Martinez    ---------------------------------------------------------------------------  -----     Indications: Swelling, varicosities, and Pain. Right worse than left.     Comparison Study: None   Performing Technologist: Ivan Croft      Examination Guidelines: A complete evaluation includes B-mode imaging,  spectral  Doppler, color Doppler, and power Doppler as needed of all accessible  portions  of each vessel. Bilateral testing is considered an integral part of a  complete  examination. Limited examinations for reoccurring indications may be  performed  as noted. The reflux portion of the exam is performed with the patient in  reverse Trendelenburg.  Significant venous reflux is defined as >500 ms in the superficial venous  system, and >1 second in the deep venous system.      Venous Reflux Times  +------------------+---------+------+-----------+------------+-------------  ----+  RIGHT             Reflux NoRefluxReflux TimeDiameter cmsComments                                         Yes                                             +------------------+---------+------+-----------+------------+-------------  ----+  CFV               no                                                        +------------------+---------+------+-----------+------------+-------------  ----+  FV mid            no                                                        +------------------+---------+------+-----------+------------+-------------  ----+  Popliteal         no                                                        +------------------+---------+------+-----------+------------+-------------  ----+  GSV at [facility]        no                            0.64                        +------------------+---------+------+-----------+------------+-------------  ----+  GSV prox thigh    no                            0.46                        +------------------+---------+------+-----------+------------+-------------  ----+  GSV mid thigh     no                            0.51                        +------------------+---------+------+-----------+------------+-------------  ----+  GSV dist thigh    no                            0.46                        +------------------+---------+------+-----------+------------+-------------  ----+  GSV at knee       no                            0.44                        +------------------+---------+------+-----------+------------+-------------  ----+  GSV prox calf               yes    >500 ms      0.77                        +------------------+---------+------+-----------+------------+-------------  ----+  SSV Pop Fossa     no  0.32                        +------------------+---------+------+-----------+------------+-------------  ----+  SSV prox calf     no                            0.35                         +------------------+---------+------+-----------+------------+-------------  ----+  SSV mid calf      no                            0.30                        +------------------+---------+------+-----------+------------+-------------  ----+  Anterior accessory          yes    >500 ms      0.62    becomes  varicose   saphenous                                               and tortuous  in                                                            mid thigh  and                                                              appears to                                                                  connect to                                                                  varicosities  at                                                            knee level          +------------------+---------+------+-----------+------------+-------------  ----+       Summary:  Right:  - No evidence of deep vein thrombosis seen in the right lower extremity,  from the common femoral  through the popliteal veins.  - No evidence of superficial venous thrombosis in the right lower  extremity.     - Venous reflux is noted in the right greater saphenous vein in the calf.  - Venous reflux is noted in the right anterior accessory saphenous vein.     *See table(s) above for measurements and observations.   Electronically signed by Monica Martinez MD on 07/16/2021 at 8:55:49 AM.      Assessment/Plan:  51 year old female presents for evaluation of bilateral lower extremity leg swelling in the setting of notable varicosities.  Her exam is consistent with CEAP classification C3 given leg edema.  Her right leg was studied today with a reflux study that shows dominant reflux in an anterior accessory great saphenous vein that is quite large measuring over 6 mm and communicates with the prominent varicosity in the distal thigh/calf by ultrasound.  I  recommended starting with medical therapy including compression stockings, elevation, exercise and weight loss.  She is already wearing knee-high compression stockings 15 to 20 mmHg and we will get her sized for some additional medical grade stockings today.  I will have her see one of my partners in 3 months to see if she would be a candidate for laser ablation or stabs of the anterior accessory saphenous.  No evidence of DVT.   Marty Heck, MD [facility] Office: [phone]

## 2021-07-18 ENCOUNTER — Encounter: Payer: BC Managed Care – PPO | Admitting: Vascular Surgery

## 2021-07-18 ENCOUNTER — Encounter (HOSPITAL_COMMUNITY): Payer: BC Managed Care – PPO

## 2021-10-23 ENCOUNTER — Ambulatory Visit: Payer: No Typology Code available for payment source | Admitting: Vascular Surgery

## 2022-07-03 ENCOUNTER — Encounter: Payer: Self-pay | Admitting: Neurology

## 2022-07-10 ENCOUNTER — Ambulatory Visit: Payer: Managed Care, Other (non HMO) | Admitting: Podiatry

## 2022-07-10 ENCOUNTER — Ambulatory Visit (INDEPENDENT_AMBULATORY_CARE_PROVIDER_SITE_OTHER): Payer: Managed Care, Other (non HMO)

## 2022-07-10 ENCOUNTER — Encounter: Payer: Self-pay | Admitting: Podiatry

## 2022-07-10 DIAGNOSIS — M722 Plantar fascial fibromatosis: Secondary | ICD-10-CM | POA: Diagnosis not present

## 2022-07-10 MED ORDER — TRIAMCINOLONE ACETONIDE 10 MG/ML IJ SUSP
20.0000 mg | Freq: Once | INTRAMUSCULAR | Status: AC
  Administered 2022-07-10: 20 mg

## 2022-07-10 NOTE — Patient Instructions (Signed)

## 2022-07-10 NOTE — Progress Notes (Signed)
Subjective:   Patient ID: Megan Stark, female   DOB: 52 y.o.   MRN: [id number]   HPI Patient states she has had a lot of pain in both her heels over the last few months and has had some swelling in her lower legs and has had full blood work which was normal.  She is due for an MRI of her brain today as she is also had some facial issues with numbness and tingling.  Patient states both heels get very sore smokes half a pack cigarettes per day moderate obesity is not active   Review of Systems  All other systems reviewed and are negative.       Objective:  Physical Exam Vitals and nursing note reviewed.  Constitutional:      Appearance: She is well-developed.  Pulmonary:     Effort: Pulmonary effort is normal.  Musculoskeletal:        General: Normal range of motion.  Skin:    General: Skin is warm.  Neurological:     Mental Status: She is alert.     Neurovascular status intact muscle strength adequate range of motion adequate exquisite discomfort medial fascial band left and right at the insertional point of the tendon into the calcaneus with fluid buildup around the medial band bilateral and also moderate edema of the lower extremity negative Denna Haggard' sign noted bilateral With varicosities noted.  Good digital perfusion well-oriented    Assessment:  Acute Planter fasciitis bilateral with some of the other issues may be contributory to the discomfort she is experiencing     Plan:  H and P x-rays reviewed at this point I did go ahead I did sterile prep I injected the plantar fascia at insertion bilateral 3 mg Kenalog 5 mg Xylocaine advised on supportive shoes and reappoint for [place] to recheck  X-rays indicate spur on the heel left over right no indication stress fracture arthritis

## 2022-07-24 ENCOUNTER — Ambulatory Visit: Payer: Managed Care, Other (non HMO) | Admitting: Podiatry

## 2022-07-24 ENCOUNTER — Encounter: Payer: Self-pay | Admitting: Podiatry

## 2022-07-24 DIAGNOSIS — M722 Plantar fascial fibromatosis: Secondary | ICD-10-CM

## 2022-07-24 NOTE — Progress Notes (Signed)
Subjective:   Patient ID: Megan Stark, female   DOB: 52 y.o.   MRN: [id number]   HPI Patient states she is feeling much better than the first visit when I saw her   ROS      Objective:  Physical Exam  Neuro vascular status intact significant diminishment of discomfort in the plantar heel region bilateral fluid buildup of the minimal nature and headaches also disappeared and facial irritation with negative MRI     Assessment:  Acute fasciitis symptoms bilateral which seems to have reduced at this time     Plan:  H&P reviewed recommended stretching exercises anti-inflammatories as needed and good shoe gear.  Spent a great deal time going over the long-term nature of this also I do think she may have had migraines in related to her head issues and hopefully those are resolved also.  All questions answered reappoint as needed

## 2022-07-30 ENCOUNTER — Ambulatory Visit: Payer: No Typology Code available for payment source | Admitting: Neurology

## 2022-11-27 ENCOUNTER — Encounter: Payer: Self-pay | Admitting: Podiatry

## 2022-11-27 ENCOUNTER — Ambulatory Visit: Payer: Managed Care, Other (non HMO) | Admitting: Podiatry

## 2022-11-27 DIAGNOSIS — M722 Plantar fascial fibromatosis: Secondary | ICD-10-CM | POA: Diagnosis not present

## 2022-11-27 MED ORDER — TRIAMCINOLONE ACETONIDE 10 MG/ML IJ SUSP
10.0000 mg | Freq: Once | INTRAMUSCULAR | Status: AC
  Administered 2022-11-27: 10 mg via INTRA_ARTICULAR

## 2022-11-28 ENCOUNTER — Ambulatory Visit: Payer: Managed Care, Other (non HMO) | Admitting: Podiatry

## 2022-11-28 NOTE — Progress Notes (Signed)
Subjective:   Patient ID: Megan Stark, female   DOB: 52 y.o.   MRN: [id number]   HPI Patient states she has started to develop heel pain in both feet again stating that she did well for at least 4 months but it is starting to bother her and she is getting ready to go out of town   ROS      Objective:  Physical Exam  Neurovascular status intact with inflammation recur plantar heel region bilateral     Assessment:  Acute plantar fasciitis reoccurrence of bilateral     Plan:  H&P reviewed and went ahead did sterile prep injected the plantar fascia bilateral 3 mg Kenalog 5 mg Xylocaine and advised on support therapy reappoint to recheck

## 2022-12-04 ENCOUNTER — Ambulatory Visit: Payer: Managed Care, Other (non HMO) | Admitting: Podiatry

## 2023-04-29 ENCOUNTER — Ambulatory Visit: Payer: Managed Care, Other (non HMO) | Admitting: Podiatry

## 2023-05-01 ENCOUNTER — Encounter: Payer: Self-pay | Admitting: Podiatry

## 2023-05-01 ENCOUNTER — Ambulatory Visit (INDEPENDENT_AMBULATORY_CARE_PROVIDER_SITE_OTHER)

## 2023-05-01 ENCOUNTER — Ambulatory Visit

## 2023-05-01 ENCOUNTER — Ambulatory Visit: Payer: Managed Care, Other (non HMO) | Admitting: Podiatry

## 2023-05-01 DIAGNOSIS — M722 Plantar fascial fibromatosis: Secondary | ICD-10-CM

## 2023-05-01 DIAGNOSIS — M778 Other enthesopathies, not elsewhere classified: Secondary | ICD-10-CM

## 2023-05-04 ENCOUNTER — Telehealth: Payer: Self-pay | Admitting: Urology

## 2023-05-04 NOTE — Telephone Encounter (Signed)
 DOS - 05/12/23   EPF LEFT --- 09811  [employer]    PER [employer]S AUTOMATED SYSTEM FOR CPT CODE 91478 NO PRIOR AUTH IS REQUIRED.   CALL REF # [id number]

## 2023-05-05 NOTE — Progress Notes (Signed)
 Subjective:   Patient ID: Megan Stark, female   DOB: 53 y.o.   MRN: [id number]   HPI Patient states she is simply not been getting better with all the treatments done including injections shoe gear modifications stretching exercises oral anti-inflammatories and the pain has been over a year   ROS      Objective:  Physical Exam  Neurovascular status intact with inflammation pain of the plantar heel chronic in nature left over right     Assessment:  Acute plantar fasciitis with chronic nature to the condition that is not responding conservatively     Plan:  H&P reviewed and discussed at great length.  Discussed x-rays with patient and at this point given the long-term nature of his symptoms and how painful it is recommended endoscopic release with the left to be done first.  Patient wants surgery understands procedure and read consent form going over alternative treatments complications.  She is going to accept risk wants surgery and will be scheduled for outpatient surgery and today I went ahead dispensed air fracture walker with proper fitting and instructions and will get used to it prior to surgery  X-rays do indicate small increase for size no indication stress fracture arthritis

## 2023-05-12 ENCOUNTER — Telehealth: Payer: Self-pay | Admitting: Podiatry

## 2023-05-12 DIAGNOSIS — M722 Plantar fascial fibromatosis: Secondary | ICD-10-CM | POA: Diagnosis not present

## 2023-05-12 NOTE — Telephone Encounter (Signed)
 Pt requesting to have medications sent to [facility] [address] [place], [place] OZ#[phone]

## 2023-05-13 NOTE — Telephone Encounter (Signed)
 Not sure of question for this patient

## 2023-05-14 NOTE — Telephone Encounter (Signed)
 Pt thought you told her you were calling in some pain medication. But she's been taking IBU and is feeling ok with that.

## 2023-05-18 ENCOUNTER — Encounter: Admitting: Podiatry

## 2023-05-20 ENCOUNTER — Encounter: Payer: Self-pay | Admitting: Podiatry

## 2023-05-20 ENCOUNTER — Ambulatory Visit (INDEPENDENT_AMBULATORY_CARE_PROVIDER_SITE_OTHER): Admitting: Podiatry

## 2023-05-20 VITALS — Ht 66.0 in | Wt 230.0 lb

## 2023-05-20 DIAGNOSIS — M722 Plantar fascial fibromatosis: Secondary | ICD-10-CM | POA: Diagnosis not present

## 2023-05-20 NOTE — Progress Notes (Signed)
 Subjective:   Patient ID: Sammuel Hines, female   DOB: 53 y.o.   MRN: [id number]   HPI Patient presents stating that the left foot overall is doing good and states that it is very hard for her to wear her boot to keep the foot stretched and is looking for another alternative neurovascular   ROS      Objective:  Physical Exam  Neurovascular status intact negative Denna Haggard' sign was noted with the patient's left heel doing very well with stitches intact and minimal inflammation.     Assessment:  Doing well post endoscopic release medial fascial band left     Plan:  H&P reviewed Ace bandage dispensed patient can get foot wet and I dispensed night splint as I want her to keep this foot stretched to 90 degrees and it is hard for her to wear the boot.  She can wear the boot when she is walking and I also dispensed surgical shoe and I did go ahead today and I gave her surgical shoe.  Patient will be seen back 2 weeks for suture removal no other treatment at that time except for ankle compression stocking

## 2023-06-03 ENCOUNTER — Ambulatory Visit (INDEPENDENT_AMBULATORY_CARE_PROVIDER_SITE_OTHER): Admitting: Podiatry

## 2023-06-03 ENCOUNTER — Encounter: Payer: Self-pay | Admitting: Podiatry

## 2023-06-03 VITALS — Ht 66.0 in | Wt 230.0 lb

## 2023-06-03 DIAGNOSIS — M722 Plantar fascial fibromatosis: Secondary | ICD-10-CM | POA: Diagnosis not present

## 2023-06-03 NOTE — Progress Notes (Signed)
 Subjective:   Patient ID: Megan Stark, female   DOB: 53 y.o.   MRN: [id number]   HPI Patient presents stating that she is doing very well with surgery very pleased with her right heel still hurting moderately   ROS      Objective:  Physical Exam  Neurovascular status intact negative Denna Haggard' sign noted wound edges coapted well stitches intact left heel     Assessment:  Doing well post endoscopic surgery left plantar fascia     Plan:  H&P reviewed stitches removed wound edges coapted well instructed on gradual increase in activity return to shoe gear and dispense ankle compression stocking.  May require surgery right that will be determined depending on how the foot does over the next month or 2

## 2023-10-01 ENCOUNTER — Ambulatory Visit: Payer: Self-pay

## 2023-10-01 ENCOUNTER — Ambulatory Visit
Admission: EM | Admit: 2023-10-01 | Discharge: 2023-10-01 | Disposition: A | Discharge status: Home / Self Care | Attending: Nurse Practitioner | Admitting: Nurse Practitioner

## 2023-10-01 DIAGNOSIS — M25511 Pain in right shoulder: Secondary | ICD-10-CM | POA: Insufficient documentation

## 2023-10-01 DIAGNOSIS — F17210 Nicotine dependence, cigarettes, uncomplicated: Secondary | ICD-10-CM | POA: Diagnosis not present

## 2023-10-01 DIAGNOSIS — R109 Unspecified abdominal pain: Secondary | ICD-10-CM | POA: Insufficient documentation

## 2023-10-01 DIAGNOSIS — Z79899 Other long term (current) drug therapy: Secondary | ICD-10-CM | POA: Diagnosis not present

## 2023-10-01 DIAGNOSIS — R1013 Epigastric pain: Secondary | ICD-10-CM | POA: Insufficient documentation

## 2023-10-01 LAB — POCT URINE DIPSTICK
Bilirubin, UA: NEGATIVE
Blood, UA: NEGATIVE
Glucose, UA: NEGATIVE mg/dL
Ketones, POC UA: NEGATIVE mg/dL
Nitrite, UA: NEGATIVE
Protein Ur, POC: NEGATIVE mg/dL
Spec Grav, UA: 1.015 (ref 1.010–1.025)
Urobilinogen, UA: 0.2 U/dL
pH, UA: 6 (ref 5.0–8.0)

## 2023-10-01 MED ORDER — POLYETHYLENE GLYCOL 3350 17 G PO PACK: 17.0000 g | PACK | Freq: Every day | ORAL | 0 refills | Status: DC

## 2023-10-01 MED ORDER — PANTOPRAZOLE SODIUM 40 MG PO TBEC: 40.0000 mg | DELAYED_RELEASE_TABLET | Freq: Every day | ORAL | 0 refills | Status: DC

## 2023-10-01 MED ORDER — LIDOCAINE VISCOUS HCL 2 % MT SOLN
15.0000 mL | Freq: Once | OROMUCOSAL | Status: AC
  Administered 2023-10-01: 15 mL via OROMUCOSAL

## 2023-10-01 MED ORDER — ALUM & MAG HYDROXIDE-SIMETH 200-200-20 MG/5ML PO SUSP
30.0000 mL | Freq: Once | ORAL | Status: AC
  Administered 2023-10-01: 30 mL via ORAL

## 2023-10-01 NOTE — Discharge Instructions (Addendum)
 A urine culture and blood work have been ordered.  You will be contacted if the pending test results are abnormal.  You will also access to your results via MyChart. Take medication as prescribed. Increase fluids and allow for plenty of rest. You may continue over-the-counter Tylenol  or ibuprofen as needed for pain, fever, or general discomfort. Recommend a bland diet to see if this helps improve your symptoms.  Avoid spicy foods, tomato-based foods, caffeine, or mint based foods.  Sure you are eating plenty of fruits and vegetables to help reduce the risk for worsening constipation. Make sure you are eating at least 2 to 3 hours before bedtime. Go to the emergency department immediately if you experience increased abdominal pain with new symptoms of fever, chills, nausea, or vomiting. As discussed, if your results are normal and you are continue to experience symptoms, I would like for you to follow-up with your primary care physician for further evaluation. Follow-up as needed.

## 2023-10-01 NOTE — ED Triage Notes (Signed)
 Pt reports she has upper mid abdominal pain that spreads throughout her abdomen x 1 week. States posterior right  shoulder pain comes and goes    Took nexium , ibuprofen, and gas x  but only slight relief

## 2023-10-01 NOTE — ED Provider Notes (Signed)
 [facility]    CSN: [id number] Arrival date & time: 10/01/23  1535      History   Chief Complaint Chief Complaint  Patient presents with   Abdominal Pain    HPI Megan Stark is a 53 y.o. female.   The history is provided by the patient.   Patient presents for complaints of epigastric abdominal pain has been present for the past week.  Patient states symptoms are mostly present at night.  She denies fever, chills, nausea, vomiting, diarrhea, gas, bloating, bloody stools, belching, or urinary symptoms.  Patient states that her pain also moves around.  She states that her pain is very minimal at present.  States that she did try her father's prescription for Nexium and Gas-X but only experienced minimal relief of her symptoms.  States that she did take ibuprofen which did help her rest.  Patient states that she normally has dinner around 6 or 7 PM, states that she does not go to bed immediately after eating dinner.  States that her last bowel movement was 4 days ago, states that she has mentioned this to her PCP who told her to eat more vegetables.  Past Medical History:  Diagnosis Date   Thyroid disease     Patient Active Problem List   Diagnosis Date Noted   Chronic venous insufficiency 07/16/2021    Past Surgical History:  Procedure Laterality Date   APPENDECTOMY     FOOT SURGERY Left 05/12/2023   EPF LEFT HEEL   LAPAROSCOPIC APPENDECTOMY  04/21/2011   Procedure: APPENDECTOMY LAPAROSCOPIC;  Surgeon: Oneil DELENA Budge, MD;  Location: [facility];  Service: General;  Laterality: N/A;   MOUTH SURGERY     TUBAL LIGATION      OB History   No obstetric history on file.      Home Medications    Prior to Admission medications   Medication Sig Start Date End Date Taking? Authorizing Provider  pantoprazole  (PROTONIX ) 40 MG tablet Take 1 tablet (40 mg total) by mouth daily. 10/01/23 10/31/23 Yes Leath-Warren, Etta PARAS, NP  polyethylene glycol (MIRALAX ) 17 g  packet Take 17 g by mouth daily. 10/01/23  Yes Leath-Warren, Etta PARAS, NP  levothyroxine  (SYNTHROID ) 125 MCG tablet Take 125 mcg by mouth daily. 06/15/22   [provider]    Family History History reviewed. No pertinent family history.  Social History Social History   Tobacco Use   Smoking status: Every Day    Current packs/day: 0.50    Types: Cigarettes  Substance Use Topics   Alcohol use: Yes    Comment: Occ   Drug use: No     Allergies   Tomato and Vicodin [hydrocodone-acetaminophen ]   Review of Systems Review of Systems Per HPI  Physical Exam Triage Vital Signs ED Triage Vitals  Encounter Vitals Group     BP 10/01/23 1544 126/83     Girls Systolic BP Percentile --      Girls Diastolic BP Percentile --      Boys Systolic BP Percentile --      Boys Diastolic BP Percentile --      Pulse Rate 10/01/23 1544 81     Resp 10/01/23 1544 18     Temp 10/01/23 1544 98.1 F (36.7 C)     Temp Source 10/01/23 1544 Oral     SpO2 10/01/23 1544 98 %     Weight --      Height --      Head Circumference --  Peak Flow --      Pain Score 10/01/23 1546 0     Pain Loc --      Pain Education --      Exclude from Growth Chart --    No data found.  Updated Vital Signs BP 126/83 (BP Location: Right Arm)   Pulse 81   Temp 98.1 F (36.7 C) (Oral)   Resp 18   LMP 02/25/2015   SpO2 98%   Visual Acuity Right Eye Distance:   Left Eye Distance:   Bilateral Distance:    Right Eye Near:   Left Eye Near:    Bilateral Near:     Physical Exam Vitals and nursing note reviewed.  Constitutional:      General: She is not in acute distress.    Appearance: She is well-developed.  HENT:     Head: Normocephalic.  Eyes:     Extraocular Movements: Extraocular movements intact.     Conjunctiva/sclera: Conjunctivae normal.     Pupils: Pupils are equal, round, and reactive to light.  Cardiovascular:     Rate and Rhythm: Normal rate and regular rhythm.     Pulses:  Normal pulses.     Heart sounds: Normal heart sounds.  Pulmonary:     Effort: Pulmonary effort is normal.     Breath sounds: Normal breath sounds.  Abdominal:     General: Bowel sounds are normal.     Palpations: Abdomen is soft.     Tenderness: There is no abdominal tenderness.  Musculoskeletal:     Cervical back: Normal range of motion.  Skin:    General: Skin is warm and dry.  Neurological:     General: No focal deficit present.     Mental Status: She is alert and oriented to person, place, and time.  Psychiatric:        Mood and Affect: Mood normal.        Behavior: Behavior normal.      UC Treatments / Results  Labs (all labs ordered are listed, but only abnormal results are displayed) Labs Reviewed  POCT URINE DIPSTICK - Abnormal; Notable for the following components:      Result Value   Leukocytes, UA Small (1+) (*)    All other components within normal limits  URINE CULTURE  CBC WITH DIFFERENTIAL/PLATELET  COMPREHENSIVE METABOLIC PANEL WITH GFR  LIPASE    EKG   Radiology No results found.  Procedures Procedures (including critical care time)  Medications Ordered in UC Medications  alum & mag hydroxide-simeth (MAALOX/MYLANTA) 200-200-20 MG/5ML suspension 30 mL (30 mLs Oral Given 10/01/23 1634)  lidocaine  (XYLOCAINE ) 2 % viscous mouth solution 15 mL (15 mLs Mouth/Throat Given 10/01/23 1634)    Initial Impression / Assessment and Plan / UC Course  I have reviewed the triage vital signs and the nursing notes.  Pertinent labs & imaging results that were available during my care of the patient were reviewed by me and considered in my medical decision making (see chart for details).  Patient presents with a 1 week history of epigastric pain.  On exam, her abdomen is nontender, her vital signs are stable, and she is well-appearing, symptoms are not consistent for acute abdomen at this time.  GI cocktail was administered with no relief of the patient's symptoms.   CBC, CMP, and lipase are pending.  Urinalysis was performed with trace leukocytes, urine culture has also been ordered.  Will start patient on Protonix  40 mg to see if this  helps her symptoms along with MiraLAX  17 g for constipation.  Supportive care recommendations were provided discussed with the patient to include fluids, rest, over-the-counter analgesics, and dietary changes.  Patient was given strict ER follow-up precautions.  Patient was in agreement with this plan of care and verbalizes understanding.  All questions were answered.  Patient stable for discharge.  Final Clinical Impressions(s) / UC Diagnoses   Final diagnoses:  Abdominal pain, unspecified abdominal location     Discharge Instructions      A urine culture and blood work have been ordered.  You will be contacted if the pending test results are abnormal.  You will also access to your results via MyChart. Take medication as prescribed. Increase fluids and allow for plenty of rest. You may continue over-the-counter Tylenol  or ibuprofen as needed for pain, fever, or general discomfort. Recommend a bland diet to see if this helps improve your symptoms.  Avoid spicy foods, tomato-based foods, caffeine, or mint based foods.  Sure you are eating plenty of fruits and vegetables to help reduce the risk for worsening constipation. Make sure you are eating at least 2 to 3 hours before bedtime. Go to the emergency department immediately if you experience increased abdominal pain with new symptoms of fever, chills, nausea, or vomiting. As discussed, if your results are normal and you are continue to experience symptoms, I would like for you to follow-up with your primary care physician for further evaluation. Follow-up as needed.     ED Prescriptions     Medication Sig Dispense Auth. Provider   pantoprazole  (PROTONIX ) 40 MG tablet Take 1 tablet (40 mg total) by mouth daily. 30 tablet Leath-Warren, Etta PARAS, NP   polyethylene  glycol (MIRALAX ) 17 g packet Take 17 g by mouth daily. 30 each Leath-Warren, Etta PARAS, NP      PDMP not reviewed this encounter.   Gilmer Etta PARAS, NP 10/01/23 1700

## 2023-10-02 ENCOUNTER — Ambulatory Visit (HOSPITAL_COMMUNITY): Payer: Self-pay

## 2023-10-02 LAB — CBC WITH DIFFERENTIAL/PLATELET
Basophils Absolute: 0 x10E3/uL (ref 0.0–0.2)
Basos: 1 %
EOS (ABSOLUTE): 0.1 x10E3/uL (ref 0.0–0.4)
Eos: 2 %
Hematocrit: 44.2 % (ref 34.0–46.6)
Hemoglobin: 14.6 g/dL (ref 11.1–15.9)
Immature Grans (Abs): 0 x10E3/uL (ref 0.0–0.1)
Immature Granulocytes: 0 %
Lymphocytes Absolute: 3.1 x10E3/uL (ref 0.7–3.1)
Lymphs: 39 %
MCH: 29.6 pg (ref 26.6–33.0)
MCHC: 33 g/dL (ref 31.5–35.7)
MCV: 90 fL (ref 79–97)
Monocytes Absolute: 0.6 x10E3/uL (ref 0.1–0.9)
Monocytes: 7 %
Neutrophils Absolute: 4.1 x10E3/uL (ref 1.4–7.0)
Neutrophils: 51 %
Platelets: 363 x10E3/uL (ref 150–450)
RBC: 4.93 x10E6/uL (ref 3.77–5.28)
RDW: 12.7 % (ref 11.7–15.4)
WBC: 7.9 x10E3/uL (ref 3.4–10.8)

## 2023-10-02 LAB — COMPREHENSIVE METABOLIC PANEL WITH GFR
ALT: 27 IU/L (ref 0–32)
AST: 24 IU/L (ref 0–40)
Albumin: 4 g/dL (ref 3.8–4.9)
Alkaline Phosphatase: 103 IU/L (ref 44–121)
BUN/Creatinine Ratio: 11 (ref 9–23)
BUN: 12 mg/dL (ref 6–24)
Bilirubin Total: 0.6 mg/dL (ref 0.0–1.2)
CO2: 19 mmol/L — ABNORMAL LOW (ref 20–29)
Calcium: 9.4 mg/dL (ref 8.7–10.2)
Chloride: 99 mmol/L (ref 96–106)
Creatinine, Ser: 1.14 mg/dL — ABNORMAL HIGH (ref 0.57–1.00)
Globulin, Total: 2.8 g/dL (ref 1.5–4.5)
Glucose: 85 mg/dL (ref 70–99)
Potassium: 4.8 mmol/L (ref 3.5–5.2)
Sodium: 137 mmol/L (ref 134–144)
Total Protein: 6.8 g/dL (ref 6.0–8.5)
eGFR: 58 mL/min/1.73 — ABNORMAL LOW (ref >59)

## 2023-10-02 LAB — LIPASE: Lipase: 28 U/L (ref 14–72)

## 2023-10-02 LAB — URINE CULTURE: Culture: <10000 — AB

## 2023-10-03 DIAGNOSIS — K8000 Calculus of gallbladder with acute cholecystitis without obstruction: Secondary | ICD-10-CM | POA: Insufficient documentation

## 2023-10-03 DIAGNOSIS — Z720 Tobacco use: Secondary | ICD-10-CM | POA: Insufficient documentation

## 2023-10-03 DIAGNOSIS — E039 Hypothyroidism, unspecified: Secondary | ICD-10-CM | POA: Insufficient documentation

## 2023-12-21 NOTE — Progress Notes (Signed)
 Subjective:  Patient ID: Megan Stark, female    DOB: 24-Feb-1971, 53 y.o.   MRN: [id number]  Patient Care Team: Deitra Morton Sebastian Nena, NP as PCP - General (Nurse Practitioner)   Chief Complaint:  Establish Care and Blurred Vision [facility] vision comes and goes )   HPI: Megan Stark is a 53 y.o. female presenting on 12/22/2023 for Establish Care and Blurred Vision [facility] vision comes and goes )   Discussed the use of AI scribe software for clinical note transcription with the patient, who gave verbal consent to proceed.  History of Present Illness Megan Stark is a 53 year old female who presents to establish care.  She experiences episodes of blurry vision, described as 'little blurry spots,' occurring twice a week and lasting about five to ten minutes. She spends significant time on the computer for work, which she suspects may contribute to her symptoms. She has not yet seen an eye doctor.  She has a history of plantar fasciitis in both feet, with surgery performed in April to cut the tendon on left foot. She received injections in March, which provided relief, but symptoms are starting to return. She does not currently have any upcoming appointments for this issue.  She has a history of constipation, previously managed with Miralax , which she is no longer taking. Bowel movements occur about once a week.  She is currently taking 125 mcg of Synthroid  for hypothyroidism. She was previously on pantoprazole  for GERD and Miralax  for constipation but has discontinued both.  She reports that a CT scan performed in August revealed a spot on her kidney, which she read about in her medical records. She has no history of kidney stones and reports no urinary symptoms or flank pain. CT Abdomen and Pelvis with Contrast (10/03/2023): Imaging revealed gallstones with pericholecystic fat stranding and a mildly dilated common bile duct (10 mm), suggestive of possible cholecystitis  or biliary obstruction. The liver showed diffuse low attenuation, consistent with fatty infiltration. A small hiatal hernia and a benign-appearing 13 mm left renal cyst with calcification were also noted. No hydronephrosis, diverticulitis, or acute bowel abnormalities were identified. Other abdominal and pelvic organs were unremarkable.  She is considering weight loss options and has an appointment for a weight loss shot at a [facility]. 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  She uses a vape, consuming one cartridge every two weeks, and is trying to quit. She previously smoked cigarettes.  She has not had a mammogram in three years and is due for one. She has not had a Pap smear recently and is considering having it done at her next appointment.       12/22/2023   10:43 AM  PHQ9 SCORE ONLY  PHQ-9 Total Score 0       12/22/2023   10:43 AM  GAD 7 : Generalized Anxiety Score  Nervous, Anxious, on Edge 0  Control/stop worrying 0  Worry too much - different things 0  Trouble relaxing 0  Restless 0  Easily annoyed or irritable 0  Afraid - awful might happen 0  Total GAD 7 Score 0  Anxiety Difficulty Not difficult at all      Relevant past medical, surgical, family, and social history reviewed and updated as indicated.  Allergies and medications reviewed and updated. Data reviewed: Chart in Epic.   Past Medical History:  Diagnosis Date   Thyroid disease     Past Surgical History:  Procedure Laterality Date   APPENDECTOMY     FOOT SURGERY Left 05/12/2023   EPF LEFT HEEL   LAPAROSCOPIC APPENDECTOMY  04/21/2011   Procedure: APPENDECTOMY LAPAROSCOPIC;  Surgeon: Oneil DELENA Budge, MD;  Location: [facility];  Service: General;  Laterality: N/A;   MOUTH SURGERY     TUBAL LIGATION      Social History   Socioeconomic History   Marital status: Legally Separated    Spouse name:  Not on file   Number of children: Not on file   Years of education: Not on file   Highest education level: Not on file  Occupational History   Not on file  Tobacco Use   Smoking status: Every Day    Current packs/day: 0.50    Types: Cigarettes   Smokeless tobacco: Not on file  Substance and Sexual Activity   Alcohol use: Yes    Comment: Occ   Drug use: No   Sexual activity: Not on file  Other Topics Concern   Not on file  Social History Narrative   Not on file   Social Drivers of Health   Financial Resource Strain: Low Risk  (10/03/2023)   Received from [facility]   Overall Financial Resource Strain (CARDIA)    How hard is it for you to pay for the very basics like food, housing, medical care, and heating?: Not very hard  Food Insecurity: No Food Insecurity (10/03/2023)   Received from [facility]   Hunger Vital Sign    Within the past 12 months, you worried that your food would run out before you got the money to buy more.: Never true    Within the past 12 months, the food you bought just didn't last and you didn't have money to get more.: Never true  Transportation Needs: No Transportation Needs (10/03/2023)   Received from [facility] - Transportation    Lack of Transportation (Medical): No    Lack of Transportation (Non-Medical): No  Physical Activity: Inactive (10/03/2023)   Received from [facility]   Exercise Vital Sign    On average, how many days per week do you engage in moderate to strenuous exercise (like a brisk walk)?: 0 days    On average, how many minutes do you engage in exercise at this level?: 0 min  Stress: Not on file  Social Connections: Unknown (10/03/2023)   Received from [facility]   Social Connection and Isolation Panel    In a typical week, how many times do you talk on the phone with family, friends, or neighbors?: More than three times a week    How often do you get together with friends or relatives?: More than three  times a week    Attends Religious Services: Not on file    Do you belong to any clubs or organizations such as church groups, unions, fraternal or athletic groups, or school groups?: No    How often do you attend meetings of the clubs or organizations you belong to?: Never    Are you married, widowed, divorced, separated, never married, or living with a partner?: Divorced  Intimate Partner Violence: Not At Risk (12/14/2023)   Received from [facility]   Humiliation, Afraid, Rape, and Kick questionnaire    Within the last year, have you been afraid of your partner or ex-partner?: No  Within the last year, have you been humiliated or emotionally abused in other ways by your partner or ex-partner?: No    Within the last year, have you been kicked, hit, slapped, or otherwise physically hurt by your partner or ex-partner?: No    Within the last year, have you been raped or forced to have any kind of sexual activity by your partner or ex-partner?: No    Outpatient Encounter Medications as of 12/22/2023  Medication Sig   levothyroxine  (SYNTHROID ) 125 MCG tablet Take 125 mcg by mouth daily.   [DISCONTINUED] levothyroxine  (SYNTHROID ) 125 MCG tablet Take 125 mcg by mouth daily.   polyethylene glycol (MIRALAX ) 17 g packet Take 17 g by mouth daily.   [DISCONTINUED] pantoprazole  (PROTONIX ) 40 MG tablet Take 1 tablet (40 mg total) by mouth daily. (Patient not taking: Reported on 12/22/2023)   [DISCONTINUED] polyethylene glycol (MIRALAX ) 17 g packet Take 17 g by mouth daily. (Patient not taking: Reported on 12/22/2023)   No facility-administered encounter medications on file as of 12/22/2023.    Allergies  Allergen Reactions   Tomato Other (See Comments)    Cold Sore    Pertinent ROS per HPI, otherwise unremarkable      Objective:  BP 115/65   Pulse 71   Temp (!) 97.3 F (36.3 C) (Temporal)   Ht 5' 6 (1.676 m)   Wt 241 lb (109.3 kg)   LMP 02/25/2015   SpO2 98%   BMI 38.90 kg/m     Wt Readings from Last 3 Encounters:  12/22/23 241 lb (109.3 kg)  06/03/23 230 lb (104.3 kg)  05/20/23 230 lb (104.3 kg)    Physical Exam Vitals and nursing note reviewed.  Constitutional:      General: She is not in acute distress.    Appearance: She is obese.  HENT:     Head: Normocephalic and atraumatic.     Right Ear: Tympanic membrane, ear canal and external ear normal. There is no impacted cerumen.     Left Ear: Tympanic membrane, ear canal and external ear normal. There is no impacted cerumen.     Nose: Nose normal.     Mouth/Throat:     Mouth: Mucous membranes are moist.  Eyes:     General: No scleral icterus.    Extraocular Movements: Extraocular movements intact.     Conjunctiva/sclera: Conjunctivae normal.     Pupils: Pupils are equal, round, and reactive to light.  Neck:     Vascular: No carotid bruit.  Cardiovascular:     Heart sounds: Normal heart sounds.  Pulmonary:     Effort: Pulmonary effort is normal.     Breath sounds: Normal breath sounds.  Abdominal:     General: Bowel sounds are normal.     Palpations: Abdomen is soft.  Musculoskeletal:        General: Normal range of motion.     Cervical back: Normal range of motion and neck supple. No rigidity or tenderness.  Lymphadenopathy:     Cervical: No cervical adenopathy.  Skin:    General: Skin is warm and dry.     Findings: No rash.  Neurological:     Mental Status: She is alert and oriented to person, place, and time.  Psychiatric:        Mood and Affect: Mood normal.        Behavior: Behavior normal.        Thought Content: Thought content normal.        Judgment:  Judgment normal.    Physical Exam MEASUREMENTS: BMI- 39.9.     Results for orders placed or performed during the hospital encounter of 10/01/23  POCT URINE DIPSTICK   Collection Time: 10/01/23  4:40 PM  Result Value Ref Range   Color, UA yellow yellow   Clarity, UA clear clear   Glucose, UA negative negative mg/dL    Bilirubin, UA negative negative   Ketones, POC UA negative negative mg/dL   Spec Grav, UA 8.984 8.989 - 1.025   Blood, UA negative negative   pH, UA 6.0 5.0 - 8.0   Protein Ur, POC negative negative mg/dL   Urobilinogen, UA 0.2 0.2 or 1.0 E.U./dL   Nitrite, UA Negative Negative   Leukocytes, UA Small (1+) (A) Negative  CBC with Differential   Collection Time: 10/01/23  4:44 PM  Result Value Ref Range   WBC 7.9 3.4 - 10.8 x10E3/uL   RBC 4.93 3.77 - 5.28 x10E6/uL   Hemoglobin 14.6 11.1 - 15.9 g/dL   Hematocrit 55.7 65.9 - 46.6 %   MCV 90 79 - 97 fL   MCH 29.6 26.6 - 33.0 pg   MCHC 33.0 31.5 - 35.7 g/dL   RDW 87.2 88.2 - 84.5 %   Platelets 363 150 - 450 x10E3/uL   Neutrophils 51 Not Estab. %   Lymphs 39 Not Estab. %   Monocytes 7 Not Estab. %   Eos 2 Not Estab. %   Basos 1 Not Estab. %   Neutrophils Absolute 4.1 1.4 - 7.0 x10E3/uL   Lymphocytes Absolute 3.1 0.7 - 3.1 x10E3/uL   Monocytes Absolute 0.6 0.1 - 0.9 x10E3/uL   EOS (ABSOLUTE) 0.1 0.0 - 0.4 x10E3/uL   Basophils Absolute 0.0 0.0 - 0.2 x10E3/uL   Immature Granulocytes 0 Not Estab. %   Immature Grans (Abs) 0.0 0.0 - 0.1 x10E3/uL  Comprehensive metabolic panel   Collection Time: 10/01/23  4:44 PM  Result Value Ref Range   Glucose 85 70 - 99 mg/dL   BUN 12 6 - 24 mg/dL   Creatinine, Ser 8.85 (H) 0.57 - 1.00 mg/dL   eGFR 58 (L) >40 fO/fpw/8.26   BUN/Creatinine Ratio 11 9 - 23   Sodium 137 134 - 144 mmol/L   Potassium 4.8 3.5 - 5.2 mmol/L   Chloride 99 96 - 106 mmol/L   CO2 19 (L) 20 - 29 mmol/L   Calcium 9.4 8.7 - 10.2 mg/dL   Total Protein 6.8 6.0 - 8.5 g/dL   Albumin 4.0 3.8 - 4.9 g/dL   Globulin, Total 2.8 1.5 - 4.5 g/dL   Bilirubin Total 0.6 0.0 - 1.2 mg/dL   Alkaline Phosphatase 103 44 - 121 IU/L   AST 24 0 - 40 IU/L   ALT 27 0 - 32 IU/L  Lipase   Collection Time: 10/01/23  4:44 PM  Result Value Ref Range   Lipase 28 14 - 72 U/L  Urine Culture   Collection Time: 10/01/23  4:58 PM   Specimen: Urine,  Clean Catch  Result Value Ref Range   Specimen Description      URINE, CLEAN CATCH Performed at [facility], [address] [place], [place] [address]    Special Requests      NONE Performed at [facility], [address] [place], [place] [address]    Culture (A)     <10,000 COLONIES/mL INSIGNIFICANT GROWTH Performed at [facility], [address] [place], [place] [address]    Report Status 10/02/2023  FINAL        Pertinent labs & imaging results that were available during my care of the patient were reviewed by me and considered in my medical decision making.  Assessment & Plan:  Megan Stark was seen today for establish care and blurred vision.  Diagnoses and all orders for this visit:  Encounter for general adult medical examination with abnormal findings -     CBC with Differential/Platelet -     CMP14+EGFR -     HepB+HepC+HIV Panel -     Thyroid Panel With TSH -     Lipid panel -     Bayer DCA Hb A1c Waived  Acquired hypothyroidism -     Thyroid Panel With TSH  Family history of diabetes mellitus in father -     Bayer DCA Hb A1c Waived  Class 2 obesity without serious comorbidity with body mass index (BMI) of 38.0 to 38.9 in adult, unspecified obesity type -     HepB+HepC+HIV Panel -     Lipid panel -     Bayer DCA Hb A1c Waived  Blurry vision, bilateral  Plantar fasciitis, bilateral  Screening mammogram for breast cancer -     MM 3D SCREENING MAMMOGRAM BILATERAL BREAST; Future  Encounter for immunization -     Flu vaccine trivalent PF, 6mos and older(Flulaval,Afluria,Fluarix,Fluzone)  Chronic constipation -     polyethylene glycol (MIRALAX ) 17 g packet; Take 17 g by mouth daily.     Assessment and Plan Megan Stark is a 53 year old Caucasian female seen today to establish care for chronic disease management, no acute distress Assessment & Plan Adult Wellness Visit Routine adult wellness visit to establish care. - Order lab tests for thyroid  function, CMP, Lipid TSH,  Hep-c - Administer flu vaccine. - Schedule mammogram. - Plan Pap smear at next appointment.  Obesity BMI is 39.9. Discussed weight loss strategies, including GLP-1 injection (Ozempic) and lifestyle modifications. Emphasized the importance of physical activity and dietary changes. - Start GLP-1 injection (Ozempic) at [facility]. - Encourage lifestyle modifications, including increased physical activity and dietary changes.  Bilateral knee pain Reports bilateral knee pain, possibly related to obesity. Weight loss may alleviate symptoms. - Reassess knee pain after weight loss.  Constipation Reports bowel movements approximately once a week. Previously on Miralax , which she stopped taking. - Reorder Miralax .  Bilateral plantar fasciitis, status post right foot surgery Bilateral plantar fasciitis with right foot surgery in April. Symptoms improving but starting to recur. Discussed custom orthotics as a potential treatment option. - Discuss custom orthotics with podiatry.  Bilateral blurry vision Intermittent blurry vision, possibly related to prolonged computer use. Advised on techniques to reduce eye strain. - Recommend eye examination, possibly at [facility] optometrist. - Advise taking breaks from screen time to reduce eye strain.  Left renal cyst CT scan showed a 13 mm cyst on the left kidney, likely benign. No further renal imaging recommended unless symptoms develop. - Monitor for symptoms such as flank pain or changes in urination. - Refer to urology if symptoms develop.  Primary hypothyroidism, on levothyroxine  Currently on 125 mcg of Synthroid . - Check thyroid function as part of lab tests.  Tobacco and vaping use Currently using one vape cartridge every two weeks. Discussed the risks of vaping compared to smoking and the importance of cessation. - Encourage cessation of vaping and smoking.      Continue all other maintenance medications.  Follow  up plan: Return in about 6 months (around 06/21/2024) for  physical with pap.   Continue healthy lifestyle choices, including diet (rich in fruits, vegetables, and lean proteins, and low in salt and simple carbohydrates) and exercise (at least 30 minutes of moderate physical activity daily).  Educational handout given for    Clinical References  Hypothyroidism  Hypothyroidism is when the thyroid gland does not make enough of certain hormones. This is called an underactive thyroid. The thyroid gland is a small gland located in the lower front part of the neck, just in front of the windpipe (trachea). This gland makes hormones that help control how the body uses food for energy (metabolism) as well as how the heart and brain function. These hormones also play a role in keeping your bones strong. When the thyroid is underactive, it produces too little of the hormones thyroxine (T4) and triiodothyronine (T3). What are the causes? This condition may be caused by: Hashimoto's disease. This is a disease in which the body's disease-fighting system (immune system) attacks the thyroid gland. This is the most common cause. Viral infections. Pregnancy. Certain medicines. Birth defects. Problems with a gland in the center of the brain (pituitary gland). Lack of enough iodine in the diet. Other causes may include: Past radiation treatments to the head or neck for cancer. Past treatment with radioactive iodine. Past exposure to radiation in the environment. Past surgical removal of part or all of the thyroid. What increases the risk? You are more likely to develop this condition if: You are female. You have a family history of thyroid conditions. You use a medicine called lithium. You take medicines that affect the immune system (immunosuppressants). What are the signs or symptoms? Common symptoms of this condition include: Not being able to tolerate cold. Feeling as though you have no energy  (lethargy). Lack of appetite. Constipation. Sadness or depression. Weight gain that is not explained by a change in diet or exercise habits. Menstrual irregularity. Dry skin, coarse hair, or brittle nails. Other symptoms may include: Muscle pain. Slowing of thought processes. Poor memory. How is this diagnosed? This condition may be diagnosed based on: Your symptoms, your medical history, and a physical exam. Blood tests. You may also have imaging tests, such as an ultrasound or MRI. How is this treated? This condition is treated with medicine that replaces the thyroid hormones that your body does not make. After you begin treatment, it may take several weeks for symptoms to go away. Follow these instructions at home: Take over-the-counter and prescription medicines only as told by your health care provider. If you start taking any new medicines, tell your health care provider. Keep all follow-up visits as told by your health care provider. This is important. As your condition improves, your dosage of thyroid hormone medicine may change. You will need to have blood tests regularly so that your health care provider can monitor your condition. Contact a health care provider if: Your symptoms do not get better with treatment. You are taking thyroid hormone replacement medicine and you: Sweat a lot. Have tremors. Feel anxious. Lose weight rapidly. Cannot tolerate heat. Have emotional swings. Have diarrhea. Feel weak. Get help right away if: You have chest pain. You have an irregular heartbeat. You have a rapid heartbeat. You have difficulty breathing. These symptoms may be an emergency. Get help right away. Call 911. Do not wait to see if the symptoms will go away. Do not drive yourself to the hospital. Summary Hypothyroidism is when the thyroid gland does not make enough of certain hormones (  it is underactive). When the thyroid is underactive, it produces too little of the  hormones thyroxine (T4) and triiodothyronine (T3). The most common cause is Hashimoto's disease, a disease in which the body's disease-fighting system (immune system) attacks the thyroid gland. The condition can also be caused by viral infections, medicine, pregnancy, or past radiation treatment to the head or neck. Symptoms may include weight gain, dry skin, constipation, feeling as though you do not have energy, and not being able to tolerate cold. This condition is treated with medicine to replace the thyroid hormones that your body does not make. This information is not intended to replace advice given to you by your health care provider. Make sure you discuss any questions you have with your health care provider. Document Revised: 02/12/2021 Document Reviewed: 02/12/2021 Elsevier Patient Education  2024 Else[employer] Inc. Blurred Vision, Adult     Having blurred vision means that you cannot see things clearly. Your vision may seem fuzzy or out of focus. It can involve your vision for objects that are close or far away. It may affect one or both eyes. There are many causes of blurred vision, including cataracts, macular degeneration, eye inflammation (uveitis), and diabetic retinopathy. In many cases, blurred vision has to do with the shape of your eye. An abnormal eye shape means you cannot focus well (refractive error). When this happens, it can cause: Faraway objects to look blurry (nearsightedness). Close objects to look blurry (farsightedness). Blurry vision at any distance (astigmatism). Refractive errors are often corrected with glasses or contacts. If you have blurred vision, it is best to see an eye care specialist. Blurred vision can be diagnosed based on your symptoms and a complete eye exam. Tell your eye care specialist about any other health problems you have, any recent eye injury, and any prior surgeries. You may need to see an eye care specialist who specializes in medical and  surgical eye problems (ophthalmologist). Your treatment will depend on what is causing your blurred vision. Follow these instructions at home: Keep all follow-up visits. This is important. These include any visits to your eye specialists. Do not drive or use machinery if your vision is blurry. Use eye drops only as told by your health care provider. If you were prescribed glasses or contact lenses, wear the glasses or contacts as told by your health care provider. Schedule eye exams regularly. Pay attention to any changes in your symptoms. Avoid rubbing your eyes. Contact a health care provider if: Your symptoms do not improve or they get worse. You have: New symptoms. A headache. Trouble seeing at night. Trouble noticing the difference between colors. You notice: Drooping of your eyelids. Drainage coming from your eyes. A rash around your eyes. Get help right away if: You have: Severe eye pain. A severe headache. A sudden change in vision. A sudden loss of vision. A vision change after an injury. You notice flashing lights in your field of vision. Your field of vision is the area that you can see without moving your eyes. Summary Having blurred vision means that you cannot see things clearly. Your vision may seem fuzzy or out of focus. There are many causes of blurred vision. In many cases, blurred vision has to do with an abnormal eye shape (refractive error), and it can be corrected with glasses or contact lenses. Pay attention to any changes in your symptoms. Contact a health care provider if your symptoms do not improve or if you have any new symptoms. This  information is not intended to replace advice given to you by your health care provider. Make sure you discuss any questions you have with your health care provider. Document Revised: 06/12/2020 Document Reviewed: 06/12/2020 Elsevier Patient Education  2024 Elsevier Inc. BMI for Adults Body mass index (BMI) is a number  found using a person's weight and height. BMI can help tell how much of a person's weight is made up of fat. BMI does not measure body fat directly. It is used instead of tests that directly measure body fat, which can be difficult and expensive. What are BMI measurements used for? BMI is useful to: Find out if your weight puts you at higher risk for medical problems. Help recommend changes, such as in diet and exercise. This can help you reach a healthy weight. BMI screening can be done again to see if these changes are working. How is BMI calculated? Your height and weight are measured. The BMI is found from those numbers. This can be done with [place] or metric measurements. Note that charts and online BMI calculators are available to help you find your BMI quickly and easily without doing these calculations. To calculate your BMI in [place] measurements: Measure your weight in pounds (lb). Multiply the number of pounds by 703. So, for an adult who weighs 150 lb, multiply that number by 703: 150 x 703, which equals 105,450. Measure your height in inches. Then multiply that number by itself to get a measurement called inches squared. So, for an adult who is 70 inches tall, the inches squared measurement is 70 inches x 70 inches, which equals 4,900 inches squared. Divide the total from step 2 (number of lb x 703) by the total from step 3 (inches squared): 105,450  4,900 = 21.5. This is your BMI. To calculate your BMI in metric measurements:  Measure your weight in kilograms (kg). For this example, the weight is 70 kg. Measure your height in meters (m). Then multiply that number by itself to get a measurement called meters squared. So, for an adult who is 1.75 m tall, the meters squared measurement is 1.75 m x 1.75 m, which equals 3.1 meters squared. Divide the number of kilograms (your weight) by the meters squared number. In this example: 70  3.1 = 22.6. This is your BMI. What do the  results mean? BMI charts are used to see if you are underweight, normal weight, overweight, or obese. The following guidelines will be used: Underweight: BMI less than 18.5. Normal weight: BMI between 18.5 and 24.9. Overweight: BMI between 25 and 29.9. Obese: BMI of 30 or above. BMI is a tool and cannot diagnose a condition. Talk with your health care provider about what your BMI means for you. Keep these notes in mind: Weight includes fat and muscle. Someone with a muscular build, such as an athlete, may have a BMI that is higher than 24.9. In cases like these, BMI is not a correct measure of body fat. If you have a BMI of 25 or higher, your provider may need to do more testing to find out if excess body fat is the cause. BMI is measured the same way for males and females. Females usually have more body fat than males of the same height and weight. Where to find more information For more information about BMI, including tools to quickly find your BMI, go to: Centers for Disease Control and Prevention: tonerpromos.no American Heart Association: heart.org National Heart, Lung, and Blood Institute: buffalodrycleaner.gl This  information is not intended to replace advice given to you by your health care provider. Make sure you discuss any questions you have with your health care provider. Document Revised: 10/31/2021 Document Reviewed: 10/24/2021 Elsevier Patient Education  2024 [employer]. Obesity, Adult Obesity is the condition of having too much total body fat. Being overweight or obese means that your weight is greater than what is considered healthy for your body size. Obesity is determined by a measurement called BMI (body mass index). BMI is an estimate of body fat and is calculated from height and weight. For adults, a BMI of 30 or higher is considered obese. Obesity can lead to other health concerns and major illnesses, including: Stroke. Coronary artery disease (CAD). Type 2 diabetes. Some types  of cancer, including cancers of the colon, breast, uterus, and gallbladder. High blood pressure (hypertension). High cholesterol. Gallbladder stones. Obesity can also contribute to: Osteoarthritis. Sleep apnea. Infertility problems. What are the causes? Common causes of this condition include: Eating daily meals that are high in calories, sugar, and fat. Drinking high amounts of sugar-sweetened beverages, such as soft drinks. Being born with genes that may make you more likely to become obese. Having a medical condition that causes obesity, including: Hypothyroidism. Polycystic ovarian syndrome (PCOS). Binge-eating disorder. Cushing syndrome. Taking certain medicines, such as steroids, antidepressants, and seizure medicines. Not being physically active (sedentary lifestyle). Not getting enough sleep. What increases the risk? The following factors may make you more likely to develop this condition: Having a family history of obesity. Living in an area with limited access to: [place], recreation centers, or sidewalks. Healthy food choices, such as grocery stores and farmers' markets. What are the signs or symptoms? The main sign of this condition is having too much body fat. How is this diagnosed? This condition is diagnosed based on: Your BMI. If you are an adult with a BMI of 30 or higher, you are considered obese. Your waist circumference. This measures the distance around your waistline. Your skinfold thickness. Your health care provider may gently pinch a fold of your skin and measure it. You may have other tests to check for underlying conditions. How is this treated? Treatment for this condition often includes changing your lifestyle. Treatment may include some or all of the following: Dietary changes. This may include developing a healthy meal plan. Regular physical activity. This may include activity that causes your heart to beat faster (aerobic exercise) and strength  training. Work with your health care provider to design an exercise program that works for you. Medicine to help you lose weight if you are unable to lose one pound a week after six weeks of healthy eating and more physical activity. Treating conditions that cause the obesity (underlying conditions). Surgery. Surgical options may include gastric banding and gastric bypass. Surgery may be done if: Other treatments have not helped to improve your condition. You have a BMI of 40 or higher. You have life-threatening health problems related to obesity. Follow these instructions at home: Eating and drinking  Follow recommendations from your health care provider about what you eat and drink. Your health care provider may advise you to: Limit fast food, sweets, and processed snack foods. Choose low-fat options, such as low-fat milk instead of whole milk. Eat five or more servings of fruits or vegetables every day. Choose healthy foods when you eat out. Keep low-fat snacks available. Limit sugary drinks, such as soda, fruit juice, sweetened iced tea, and flavored milk. Drink enough water to  keep your urine pale yellow. Do not follow a fad diet. Fad diets can be unhealthy and even dangerous. Other healthful choices include: Eat at home more often. This gives you more control over what you eat. Learn to read food labels. This will help you understand how much food is considered one serving. Learn what a healthy serving size is. Physical activity Exercise regularly, as told by your health care provider. Most adults should get up to 150 minutes of moderate-intensity exercise every week. Ask your health care provider what types of exercise are safe for you and how often you should exercise. Warm up and stretch before being active. Cool down and stretch after being active. Rest between periods of activity. Lifestyle Work with your health care provider and a dietitian to set a weight-loss goal that is  healthy and reasonable for you. Limit your screen time. Find ways to reward yourself that do not involve food. Do not drink alcohol if: Your health care provider tells you not to drink. You are pregnant, may be pregnant, or are planning to become pregnant. If you drink alcohol: Limit how much you have to: 0-1 drink a day for women. 0-2 drinks a day for men. Know how much alcohol is in your drink. In the [place] one drink equals one 12 oz bottle of beer (355 mL), one 5 oz glass of wine (148 mL), or one 1 oz glass of hard liquor (44 mL). General instructions Keep a weight-loss journal to keep track of the food you eat and how much exercise you get. Take over-the-counter and prescription medicines only as told by your health care provider. Take vitamins and supplements only as told by your health care provider. Consider joining a support group. Your health care provider may be able to recommend a support group. Pay attention to your mental health as obesity can lead to depression or self esteem issues. Keep all follow-up visits. This is important. Contact a health care provider if: You are unable to meet your weight-loss goal after six weeks of dietary and lifestyle changes. You have trouble breathing. Summary Obesity is the condition of having too much total body fat. Being overweight or obese means that your weight is greater than what is considered healthy for your body size. Work with your health care provider and a dietitian to set a weight-loss goal that is healthy and reasonable for you. Exercise regularly, as told by your health care provider. Ask your health care provider what types of exercise are safe for you and how often you should exercise. This information is not intended to replace advice given to you by your health care provider. Make sure you discuss any questions you have with your health care provider. Document Revised: 09/18/2020 Document Reviewed: 09/18/2020 Elsevier  Patient Education  2024 [employer]. Preventive Care 86-107 Years Old, Female Preventive care refers to lifestyle choices and visits with your health care provider that can promote health and wellness. Preventive care visits are also called wellness exams. What can I expect for my preventive care visit? Counseling Your health care provider may ask you questions about your: Medical history, including: Past medical problems. Family medical history. Pregnancy history. Current health, including: Menstrual cycle. Method of birth control. Emotional well-being. Home life and relationship well-being. Sexual activity and sexual health. Lifestyle, including: Alcohol, nicotine or tobacco, and drug use. Access to firearms. Diet, exercise, and sleep habits. Work and work astronomer. Sunscreen use. Safety issues such as seatbelt and bike helmet use. Physical  exam Your health care provider will check your: Height and weight. These may be used to calculate your BMI (body mass index). BMI is a measurement that tells if you are at a healthy weight. Waist circumference. This measures the distance around your waistline. This measurement also tells if you are at a healthy weight and may help predict your risk of certain diseases, such as type 2 diabetes and high blood pressure. Heart rate and blood pressure. Body temperature. Skin for abnormal spots. What immunizations do I need?  Vaccines are usually given at various ages, according to a schedule. Your health care provider will recommend vaccines for you based on your age, medical history, and lifestyle or other factors, such as travel or where you work. What tests do I need? Screening Your health care provider may recommend screening tests for certain conditions. This may include: Lipid and cholesterol levels. Diabetes screening. This is done by checking your blood sugar (glucose) after you have not eaten for a while (fasting). Pelvic exam and  Pap test. Hepatitis B test. Hepatitis C test. HIV (human immunodeficiency virus) test. STI (sexually transmitted infection) testing, if you are at risk. Lung cancer screening. Colorectal cancer screening. Mammogram. Talk with your health care provider about when you should start having regular mammograms. This may depend on whether you have a family history of breast cancer. BRCA-related cancer screening. This may be done if you have a family history of breast, ovarian, tubal, or peritoneal cancers. Bone density scan. This is done to screen for osteoporosis. Talk with your health care provider about your test results, treatment options, and if necessary, the need for more tests. Follow these instructions at home: Eating and drinking  Eat a diet that includes fresh fruits and vegetables, whole grains, lean protein, and low-fat dairy products. Take vitamin and mineral supplements as recommended by your health care provider. Do not drink alcohol if: Your health care provider tells you not to drink. You are pregnant, may be pregnant, or are planning to become pregnant. If you drink alcohol: Limit how much you have to 0-1 drink a day. Know how much alcohol is in your drink. In the [place] one drink equals one 12 oz bottle of beer (355 mL), one 5 oz glass of wine (148 mL), or one 1 oz glass of hard liquor (44 mL). Lifestyle Brush your teeth every morning and night with fluoride toothpaste. Floss one time each day. Exercise for at least 30 minutes 5 or more days each week. Do not use any products that contain nicotine or tobacco. These products include cigarettes, chewing tobacco, and vaping devices, such as e-cigarettes. If you need help quitting, ask your health care provider. Do not use drugs. If you are sexually active, practice safe sex. Use a condom or other form of protection to prevent STIs. If you do not wish to become pregnant, use a form of birth control. If you plan to become  pregnant, see your health care provider for a prepregnancy visit. Take aspirin only as told by your health care provider. Make sure that you understand how much to take and what form to take. Work with your health care provider to find out whether it is safe and beneficial for you to take aspirin daily. Find healthy ways to manage stress, such as: Meditation, yoga, or listening to music. Journaling. Talking to a trusted person. Spending time with friends and family. Minimize exposure to UV radiation to reduce your risk of skin cancer. Safety Always wear  your seat belt while driving or riding in a vehicle. Do not drive: If you have been drinking alcohol. Do not ride with someone who has been drinking. When you are tired or distracted. While texting. If you have been using any mind-altering substances or drugs. Wear a helmet and other protective equipment during sports activities. If you have firearms in your house, make sure you follow all gun safety procedures. Seek help if you have been physically or sexually abused. What's next? Visit your health care provider once a year for an annual wellness visit. Ask your health care provider how often you should have your eyes and teeth checked. Stay up to date on all vaccines. This information is not intended to replace advice given to you by your health care provider. Make sure you discuss any questions you have with your health care provider. Document Revised: 08/08/2020 Document Reviewed: 08/08/2020 Elsevier Patient Education  2024 Elsevier Inc. Cancer Screening: Female A cancer screening is a test or exam that checks for cancer. Work with your health care provider to create a cancer screening schedule that protects your health. Who should have screening? All females should be considered for screening of certain cancers, including breast cancer, cervical cancer, colorectal cancer, endometrial cancer, lung cancer, and skin cancer. Your health  care provider may recommend screenings for other types of cancer if: You have had cancer before. You have a family member with cancer. You have genes that could increase the risk of cancer. You have risk factors for certain cancers, such as current or past use of tobacco products or being overweight. What are the benefits of screening? Cancer screening is done to look for cancer in the very early stages, before it spreads and becomes harder to treat and before you would start to notice symptoms. Finding cancer early improves the chances of successful treatment. It may save your life. When should I be screened for cancer? When you should be screened for cancer depends on: Your age. Your medical history and your family's medical history. Certain lifestyle factors, such as smoking or other use of tobacco products. Environmental exposure, such as to asbestos. How is screening done? Breast cancer Breast cancer screening is done with a test that takes images of breast tissue (mammogram) using an X-ray machine. Here are some screening guidelines for females at average risk: When you are 82-51 years old, you should be given the choice to start having mammograms. When you are 73-58 years old, you should have a mammogram every year. You may start having mammograms before you are 51 years old if you have risk factors for breast cancer, such as having an immediate family member with breast cancer. At 48 years old or older, you should have a mammogram every 1-2 years for as long as you are in good health and have a life expectancy of 10 years or longer. It is important to know what your breasts look and feel like so you can report any changes to your health care provider.   Cervical cancer Cervical cancer screening is done with an HPV (human papillomavirus) test to identify the virus that causes cervical cancer. To perform the test, a health care provider takes a swab of cells from the lowest part of the  uterus (cervix) during a pelvic exam. This test may be performed along with a Pap test. This testchecks for abnormalities in the cervix. All females at average risk should consider being screened for cervical cancer starting no later than 51 years  old and continuing until 51 years old. Screening should not begin earlier than 51 years old. You will have tests every 3-5 years, depending on your results and the type of screening test. Talk with your health care provider about which screening test is right for you and how often you should be screened. If you have had the HPV vaccine, you will still be screened for cervical cancer and follow normal screening recommendations. You do not need to be screened for cervical cancer if any of the following apply to you: You are older than 51 years old and you have had normal screening tests in the past 10 years with no serious cervical precancer or cancer in the last 25 years. Your cervix and uterus have been removed, and you have never had cervical cancer or abnormal cells that could become cancer (precancerous cells). Colorectal cancer Colorectal cancer screening looks for cancer or for growths called polyps that often form before cancer starts. Tests to look for cancer or polyps include: Colonoscopy or flexible sigmoidoscopy. For these procedures, a flexible tube with a small camera is inserted into the rectum. CT colonography. This test uses X-rays and contrast dye to check the colon for polyps. Tests to look for cancer in the stool (feces) include: Guaiac-based fecal occult blood test (FOBT). This test can find blood in stool. It can be done at home with a kit. Fecal immunochemical test (FIT). This test can find blood in stool. For this test, you will need to collect stool samples at home. Stool DNA test. This test looks for blood in stool and any changes in DNA that can lead to colon cancer. For this test, you will need to collect a stool sample at home and  send it to a lab. All adults should have screenings starting at 51 years old and continuing through 51 years old. For females 63-54 years old, the decision to be screened should be based on a person's preferences, life expectancy, overall health, and prior screening history. Your health care provider may recommend screening before 51 years old. You will have tests every 1-10 years, depending on your results and the type of screening test. People at increased risk should start screening at an earlier age. Talk with your health care provider about which screening test is right for you and how often you should be screened. Endometrial cancer There is no standard screening test for endometrial cancer, and females at average risk do not routinely need to have this screening. Talk with your health care provider about whether screening is right for you. If it is, this screening is performed through: Endometrial tissue biopsy. This tests a sample of tissue taken from the lining of the uterus. Vaginal ultrasound. If you are at increased risk for endometrial cancer, you may need to have these tests more often than normal. You are at increased risk if: You have a family history of ovarian, uterine, or certain types of colon cancer. You are taking tamoxifen, a medicine used to treat breast cancer. If you have reached menopause, it is especially important to talk with your health care provider about any vaginal bleeding or spotting. Screening for endometrial cancer is not recommended for females who do not have symptoms of the cancer, such as vaginal bleeding. Lung cancer Lung cancer screening is done with a CT scan that looks for abnormal changes in the lungs. Discuss lung cancer screening with your health care provider if you are 7-60 years old and if any of the  following apply to you: You currently smoke. You used to smoke heavily. You have a smoking history of 1 pack of cigarettes a day for 20 years or 2 packs  a day for 10 years. You may need to be screened every year if you smoke heavily or if you used to smoke. Skin cancer Skin cancer screening is done by checking the skin for unusual moles or spots and any changes in existing moles. Your health care provider should check your skin for signs of skin cancer at every physical exam. You should check your skin every month and tell your health care provider right away if anything looks unusual. Females with a higher-than-normal risk for skin cancer may want to see a skin specialist (dermatologist) for an annual body check. Where to find more information American Cancer Society: cancer.org Centers for Disease Control and Prevention: tonerpromos.no National Cancer Institute: cancer.gov [place].S. Department of Health and Human Services: travellesson.ca Contact a health care provider if: You have concerns about any signs or symptoms of cancer. These may include: Skin problems. You may have: Moles of an unusual shape or color. Changes in existing moles. A sore on your skin that does not heal. Tiredness (fatigue) that does not go away. Losing weight without trying. Blood in your urine or stool. Problems with coughing or breathing. These may include: Coughing or trouble breathing that does not go away. Coughing up blood. Lumps or other changes in your breasts. Vaginal bleeding, spotting, or changes in your period. Frequent pain or cramping in your abdomen. This information is not intended to replace advice given to you by your health care provider. Make sure you discuss any questions you have with your health care provider. Document Revised: 02/18/2022 Document Reviewed: 09/02/2021 Elsevier Patient Education  2024 Else[employer] Inc.  The above assessment and management plan was discussed with the patient. The patient verbalized understanding of and has agreed to the management plan. Patient is aware to call the clinic if they develop any new symptoms or if symptoms  persist or worsen. Patient is aware when to return to the clinic for a follow-up visit. Patient educated on when it is appropriate to go to the emergency department.    Brittinee Risk [place], DNP [facility] [address] [place], [place] [address] [phone]

## 2023-12-22 ENCOUNTER — Encounter: Payer: Self-pay | Admitting: Nurse Practitioner

## 2023-12-22 ENCOUNTER — Ambulatory Visit: Admitting: Nurse Practitioner

## 2023-12-22 VITALS — BP 115/65 | HR 71 | Temp 97.3°F | Ht 66.0 in | Wt 241.0 lb

## 2023-12-22 DIAGNOSIS — Z23 Encounter for immunization: Secondary | ICD-10-CM

## 2023-12-22 DIAGNOSIS — M722 Plantar fascial fibromatosis: Secondary | ICD-10-CM | POA: Insufficient documentation

## 2023-12-22 DIAGNOSIS — E66812 Obesity, class 2: Secondary | ICD-10-CM | POA: Diagnosis not present

## 2023-12-22 DIAGNOSIS — Z833 Family history of diabetes mellitus: Secondary | ICD-10-CM | POA: Insufficient documentation

## 2023-12-22 DIAGNOSIS — E039 Hypothyroidism, unspecified: Secondary | ICD-10-CM

## 2023-12-22 DIAGNOSIS — Z0001 Encounter for general adult medical examination with abnormal findings: Secondary | ICD-10-CM | POA: Insufficient documentation

## 2023-12-22 DIAGNOSIS — Z6838 Body mass index (BMI) 38.0-38.9, adult: Secondary | ICD-10-CM | POA: Insufficient documentation

## 2023-12-22 DIAGNOSIS — K5909 Other constipation: Secondary | ICD-10-CM

## 2023-12-22 DIAGNOSIS — H538 Other visual disturbances: Secondary | ICD-10-CM | POA: Insufficient documentation

## 2023-12-22 DIAGNOSIS — Z1231 Encounter for screening mammogram for malignant neoplasm of breast: Secondary | ICD-10-CM

## 2023-12-22 LAB — LIPID PANEL

## 2023-12-22 LAB — BAYER DCA HB A1C WAIVED: HB A1C (BAYER DCA - WAIVED): 5.4 % (ref 4.8–5.6)

## 2023-12-22 MED ORDER — POLYETHYLENE GLYCOL 3350 17 G PO PACK: 17.0000 g | PACK | Freq: Every day | ORAL | 3 refills | Status: DC

## 2023-12-23 ENCOUNTER — Ambulatory Visit: Payer: Self-pay | Admitting: Nurse Practitioner

## 2023-12-23 DIAGNOSIS — E782 Mixed hyperlipidemia: Secondary | ICD-10-CM

## 2023-12-23 DIAGNOSIS — E039 Hypothyroidism, unspecified: Secondary | ICD-10-CM

## 2023-12-23 LAB — LIPID PANEL
Chol/HDL Ratio: 5.7 ratio — ABNORMAL HIGH (ref 0.0–4.4)
Cholesterol, Total: 226 mg/dL — ABNORMAL HIGH (ref 100–199)
HDL: 40 mg/dL
LDL Chol Calc (NIH): 152 mg/dL — ABNORMAL HIGH (ref 0–99)
Triglycerides: 184 mg/dL — ABNORMAL HIGH (ref 0–149)
VLDL Cholesterol Cal: 34 mg/dL (ref 5–40)

## 2023-12-23 LAB — CMP14+EGFR
ALT: 22 [IU]/L (ref 0–32)
AST: 18 [IU]/L (ref 0–40)
Albumin: 4.2 g/dL (ref 3.8–4.9)
Alkaline Phosphatase: 96 [IU]/L (ref 49–135)
BUN/Creatinine Ratio: 12 (ref 9–23)
BUN: 11 mg/dL (ref 6–24)
Bilirubin Total: 0.5 mg/dL (ref 0.0–1.2)
CO2: 21 mmol/L (ref 20–29)
Calcium: 9.1 mg/dL (ref 8.7–10.2)
Chloride: 105 mmol/L (ref 96–106)
Creatinine, Ser: 0.93 mg/dL (ref 0.57–1.00)
Globulin, Total: 2.5 g/dL (ref 1.5–4.5)
Glucose: 90 mg/dL (ref 70–99)
Potassium: 4.7 mmol/L (ref 3.5–5.2)
Sodium: 139 mmol/L (ref 134–144)
Total Protein: 6.7 g/dL (ref 6.0–8.5)
eGFR: 74 mL/min/{1.73_m2}

## 2023-12-23 LAB — CBC WITH DIFFERENTIAL/PLATELET
Basophils Absolute: 0.1 x10E3/uL (ref 0.0–0.2)
Basos: 1 %
EOS (ABSOLUTE): 0.2 x10E3/uL (ref 0.0–0.4)
Eos: 3 %
Hematocrit: 43.2 % (ref 34.0–46.6)
Hemoglobin: 14.1 g/dL (ref 11.1–15.9)
Immature Grans (Abs): 0 x10E3/uL (ref 0.0–0.1)
Immature Granulocytes: 0 %
Lymphocytes Absolute: 2.1 x10E3/uL (ref 0.7–3.1)
Lymphs: 31 %
MCH: 29.6 pg (ref 26.6–33.0)
MCHC: 32.6 g/dL (ref 31.5–35.7)
MCV: 91 fL (ref 79–97)
Monocytes Absolute: 0.5 x10E3/uL (ref 0.1–0.9)
Monocytes: 7 %
Neutrophils Absolute: 3.9 x10E3/uL (ref 1.4–7.0)
Neutrophils: 58 %
Platelets: 356 x10E3/uL (ref 150–450)
RBC: 4.77 x10E6/uL (ref 3.77–5.28)
RDW: 12.9 % (ref 11.7–15.4)
WBC: 6.8 x10E3/uL (ref 3.4–10.8)

## 2023-12-23 LAB — HEPB+HEPC+HIV PANEL
HIV Screen 4th Generation wRfx: NONREACTIVE
Hep B E Ab: NONREACTIVE
Hep B Surface Ab, Qual: NONREACTIVE
Hep C Virus Ab: NONREACTIVE

## 2023-12-23 LAB — THYROID PANEL WITH TSH
Free Thyroxine Index: 2.1 (ref 1.2–4.9)
T3 Uptake Ratio: 24 % (ref 24–39)
T4, Total: 8.9 ug/dL (ref 4.5–12.0)
TSH: 11.8 u[IU]/mL — AB (ref 0.450–4.500)

## 2023-12-23 MED ORDER — LEVOTHYROXINE SODIUM 175 MCG PO TABS: 175.0000 ug | ORAL_TABLET | Freq: Every day | ORAL | 0 refills | Status: DC

## 2023-12-23 MED ORDER — FENOFIBRATE 48 MG PO TABS: 48.0000 mg | ORAL_TABLET | Freq: Every day | ORAL | 0 refills | Status: AC

## 2024-01-20 ENCOUNTER — Other Ambulatory Visit

## 2024-01-20 ENCOUNTER — Other Ambulatory Visit: Payer: Self-pay | Admitting: Nurse Practitioner

## 2024-01-20 ENCOUNTER — Ambulatory Visit
Admission: RE | Admit: 2024-01-20 | Discharge: 2024-01-20 | Disposition: A | Discharge status: Home / Self Care | Source: Ambulatory Visit | Attending: Nurse Practitioner | Admitting: Nurse Practitioner

## 2024-01-20 DIAGNOSIS — Z1231 Encounter for screening mammogram for malignant neoplasm of breast: Secondary | ICD-10-CM

## 2024-01-20 DIAGNOSIS — R7989 Other specified abnormal findings of blood chemistry: Secondary | ICD-10-CM

## 2024-01-21 LAB — THYROID PANEL WITH TSH
Free Thyroxine Index: 5.4 — ABNORMAL HIGH (ref 1.2–4.9)
T3 Uptake Ratio: 39 % (ref 24–39)
T4, Total: 13.8 ug/dL — ABNORMAL HIGH (ref 4.5–12.0)
TSH: 0.12 u[IU]/mL — ABNORMAL LOW (ref 0.450–4.500)

## 2024-01-26 ENCOUNTER — Ambulatory Visit: Payer: Self-pay | Admitting: Nurse Practitioner

## 2024-01-26 DIAGNOSIS — E039 Hypothyroidism, unspecified: Secondary | ICD-10-CM

## 2024-01-26 DIAGNOSIS — R7989 Other specified abnormal findings of blood chemistry: Secondary | ICD-10-CM | POA: Insufficient documentation

## 2024-01-26 MED ORDER — LEVOTHYROXINE SODIUM 25 MCG PO TABS: 25.0000 ug | ORAL_TABLET | Freq: Every day | ORAL | 0 refills | Status: DC

## 2024-01-28 ENCOUNTER — Ambulatory Visit: Payer: Self-pay | Admitting: Nurse Practitioner

## 2024-02-22 ENCOUNTER — Ambulatory Visit: Payer: Self-pay

## 2024-02-22 ENCOUNTER — Ambulatory Visit (HOSPITAL_COMMUNITY)
Admission: RE | Admit: 2024-02-22 | Discharge: 2024-02-22 | Disposition: A | Discharge status: Home / Self Care | Source: Ambulatory Visit | Attending: Surgery | Admitting: Surgery

## 2024-02-22 ENCOUNTER — Other Ambulatory Visit (HOSPITAL_COMMUNITY): Payer: Self-pay | Admitting: Orthopedic Surgery

## 2024-02-22 DIAGNOSIS — R52 Pain, unspecified: Secondary | ICD-10-CM | POA: Insufficient documentation

## 2024-02-22 NOTE — Telephone Encounter (Signed)
 FYI Only or Action Required?: Action required by provider: clinical question for provider and questions if needs to return to previous dose of thyroid  med due to leg swelling/ going to UC today for rt calf pain, increased when walking.  Patient was last seen in primary care on 12/22/2023 by Deitra Morton Sebastian Nena, NP.  Called Nurse Triage reporting Leg Swelling.  Symptoms began several days ago.  Interventions attempted: OTC medications: OTC pain meds, compression stockings.  Symptoms are: gradually worsening.  Triage Disposition: See HCP Within 4 Hours (Or PCP Triage)  Patient/caregiver understands and will follow disposition?:     2 wks ago started with swelling. Last week swelling worsens in rt leg. Now increasing pain in calf when walking.     Copied from CRM #[id number]. Topic: Clinical - Red Word Triage >> Feb 22, 2024 10:24 AM Chiquita SQUIBB wrote: Red Word that prompted transfer to Nurse Triage: Patient is calling in stating that both of her legs are swollen possibly due to her new medication. Reason for Disposition  [1] Thigh or calf pain AND [2] only 1 side AND [3] present > 1 hour  (Exception: Chronic unchanged pain.)  Answer Assessment - Initial Assessment Questions 1. ONSET: When did the pain start?      Few days ago 2. LOCATION: Where is the pain located?      Behind 3. PAIN: How bad is the pain?    (Scale 1-10; or mild, moderate, severe)     2/10, has to limp 4. WORK OR EXERCISE: Has there been any recent work or exercise that involved this part of the body?      no 5. CAUSE: What do you think is causing the leg pain?     Increased thyroid  medication 6. OTHER SYMPTOMS: Do you have any other symptoms? (e.g., chest pain, back pain, breathing difficulty, swelling, rash, fever, numbness, weakness)     Denies weakness, numbness, SOB, CP  Protocols used: Leg Pain-A-AH

## 2024-02-22 NOTE — Telephone Encounter (Signed)
 I spoke with pt and she thinks the swelling in her leg is coming from the decreased dose of levothyroxine . Pt does have appt with Endo but not until 04/2024. She is aware PCP out of office till 1/5 and is ok with waiting till she returns to address dosage. Please advise.

## 2024-03-01 DIAGNOSIS — E039 Hypothyroidism, unspecified: Secondary | ICD-10-CM

## 2024-03-01 DIAGNOSIS — R7989 Other specified abnormal findings of blood chemistry: Secondary | ICD-10-CM

## 2024-03-01 NOTE — Telephone Encounter (Signed)
 Lab placed and apt made. LS

## 2024-03-01 NOTE — Progress Notes (Signed)
"  N/a   "

## 2024-03-11 ENCOUNTER — Other Ambulatory Visit: Payer: Self-pay

## 2024-03-11 DIAGNOSIS — E039 Hypothyroidism, unspecified: Secondary | ICD-10-CM

## 2024-03-11 DIAGNOSIS — R7989 Other specified abnormal findings of blood chemistry: Secondary | ICD-10-CM

## 2024-03-12 LAB — TSH+FREE T4
Free T4: 0.88 ng/dL (ref 0.82–1.77)
TSH: 63.2 u[IU]/mL — ABNORMAL HIGH (ref 0.450–4.500)

## 2024-03-14 ENCOUNTER — Ambulatory Visit: Payer: Self-pay | Admitting: Nurse Practitioner

## 2024-03-14 DIAGNOSIS — E039 Hypothyroidism, unspecified: Secondary | ICD-10-CM

## 2024-03-14 MED ORDER — LEVOTHYROXINE SODIUM 125 MCG PO TABS: 125.0000 ug | ORAL_TABLET | Freq: Every day | ORAL | 0 refills | Status: AC

## 2024-03-17 NOTE — Patient Instructions (Signed)

## 2024-03-24 ENCOUNTER — Ambulatory Visit (INDEPENDENT_AMBULATORY_CARE_PROVIDER_SITE_OTHER): Admitting: Nurse Practitioner

## 2024-03-24 ENCOUNTER — Encounter: Payer: Self-pay | Admitting: Nurse Practitioner

## 2024-03-24 VITALS — BP 114/88 | HR 57 | Ht 66.0 in | Wt 219.2 lb

## 2024-03-24 DIAGNOSIS — E039 Hypothyroidism, unspecified: Secondary | ICD-10-CM | POA: Diagnosis not present

## 2024-03-24 NOTE — Progress Notes (Signed)
 "                                                                    Endocrinology Consult Note                                         03/24/2024, 10:35 AM  Subjective:   Subjective    Megan Stark is a 54 y.o.-year-old female patient being seen in consultation for hypothyroidism referred by Deitra Morton Sebastian Nena, NP.   Past Medical History:  Diagnosis Date   Thyroid  disease     Past Surgical History:  Procedure Laterality Date   APPENDECTOMY     FOOT SURGERY Left 05/12/2023   EPF LEFT HEEL   LAPAROSCOPIC APPENDECTOMY  04/21/2011   Procedure: APPENDECTOMY LAPAROSCOPIC;  Surgeon: Oneil DELENA Budge, MD;  Location: [facility];  Service: General;  Laterality: N/A;   MOUTH SURGERY     TUBAL LIGATION      Social History   Socioeconomic History   Marital status: Single    Spouse name: Not on file   Number of children: Not on file   Years of education: Not on file   Highest education level: Not on file  Occupational History   Not on file  Tobacco Use   Smoking status: Every Day    Current packs/day: 0.50    Types: Cigarettes   Smokeless tobacco: Not on file  Substance and Sexual Activity   Alcohol use: Yes    Comment: Occ   Drug use: No   Sexual activity: Not on file  Other Topics Concern   Not on file  Social History Narrative   Not on file   Social Drivers of Health   Tobacco Use: High Risk (03/24/2024)   Patient History    Smoking Tobacco Use: Every Day    Smokeless Tobacco Use: Unknown    Passive Exposure: Not on file  Financial Resource Strain: Low Risk (10/03/2023)   Received [facility]   Overall Financial Resource Strain (CARDIA)    How hard is it for you to pay for the very basics like food, housing, medical care, and heating?: Not very hard  Food Insecurity: No Food Insecurity (10/03/2023)   Received from [facility]   Epic    Within the past 12 months, you worried that your food would run out before you got the money to buy more.: Never  true    Within the past 12 months, the food you bought just didn't last and you didn't have money to get more.: Never true  Transportation Needs: No Transportation Needs (10/03/2023)   Received from [facility] - [facility]    Lack of Transportation (Medical): No    Lack of Transportation (Non-Medical): No  Physical Activity: Inactive (10/03/2023)   Received from [facility]   Exercise Vital Sign    On average, how many days per week do you engage in moderate to strenuous exercise (like a brisk walk)?: 0 days    On average, how many minutes do you engage in exercise at this level?: 0 min  Stress: Not  on file  Social Connections: Unknown (10/03/2023)   Received from [facility]   Social Connection and Isolation Panel    In a typical week, how many times do you talk on the phone with family, friends, or neighbors?: More than three times a week    How often do you get together with friends or relatives?: More than three times a week    Attends Religious Services: Not on file    Do you belong to any clubs or organizations such as church groups, unions, fraternal or athletic groups, or school groups?: No    How often do you attend meetings of the clubs or organizations you belong to?: Never    Are you married, widowed, divorced, separated, never married, or living with a partner?: Divorced  Depression (PHQ2-9): Low Risk (12/22/2023)   Depression (PHQ2-9)    PHQ-2 Score: 0  Alcohol Screen: Not on file  Housing: Not on file  Utilities: Low Risk (10/03/2023)   Received from [facility]   Utilities    Within the past 12 months, have you been unable to get utilities(heat, electricity) when it was really needed?: No  Health Literacy: Low Risk (10/03/2023)   Received from [facility]    How often do you need to have someone help you when you read instructions, pamphlets, or other written material from your doctor or pharmacy?: Never    History  reviewed. No pertinent family history.  Outpatient Encounter Medications as of 03/24/2024  Medication Sig   fenofibrate  (TRICOR ) 48 MG tablet Take 1 tablet (48 mg total) by mouth daily.   levothyroxine  (SYNTHROID ) 125 MCG tablet Take 1 tablet (125 mcg total) by mouth daily.   [DISCONTINUED] polyethylene glycol (MIRALAX ) 17 g packet Take 17 g by mouth daily. (Patient not taking: Reported on 03/24/2024)   No facility-administered encounter medications on file as of 03/24/2024.    ALLERGIES: Allergies[1] VACCINATION STATUS: Immunization History  Administered Date(s) Administered   Influenza, Seasonal, Injecte, Preservative Fre 12/22/2023     HPI   Megan Stark  is a patient with the above medical history. she was diagnosed with hypothyroidism at approximate age of 75 years (shortly after the birth of her son), which required subsequent initiation of thyroid  hormone replacement therapy. she was given various doses of Synthroid  over the years, currently on 125 micrograms. she reports compliance to this medication:  Taking it daily on empty stomach with water, separated by >30 minutes before breakfast and other medications, and by at least 4 hours from calcium, iron, PPIs, multivitamins .  She was seen at [facility] and maintained on her Levothyroxine  125 mcg dose for nearly 10 years.  Her new PCP had run some labs, increased the dose of Levothyroxine  to 175 mcg and then rechecked and lowered her dose to 25 mcg.  At which time she developed significant swelling and under-active symptoms.  She was then bumped back to her 125 mcg dose and has been on this since January 16th.  She also notes she is on compound Tirzepatide from [facility] but is unsure of the dosage.  She did lose some weight initially (was over 250lbs) but has not seen much weight loss recently.  I reviewed patient's thyroid  tests:  Lab Results  Component Value Date   TSH 63.200 (H) 03/11/2024   TSH 0.120 (L) 01/20/2024    TSH 11.800 (H) 12/22/2023   FREET4 0.88 03/11/2024     Pt describes: -  weight gain - fatigue - depression - constipation - dry skin - hair loss  Pt denies feeling nodules in neck, hoarseness, dysphagia/odynophagia, SOB with lying down.  she denies any known family history of thyroid  disorders.  No family history of thyroid  cancer.  No history of radiation therapy to head or neck.  No recent use of iodine supplements.  Denies use of Biotin containing supplements.   ROS:  Constitutional: + weight gain, + fatigue, no subjective hyperthermia, no subjective hypothermia Eyes: no blurry vision, no xerophthalmia ENT: no sore throat, no nodules palpated in throat, no dysphagia/odynophagia, no hoarseness Cardiovascular: no chest pain, no SOB, no palpitations, no leg swelling Respiratory: no cough, no SOB Gastrointestinal: no nausea/vomiting/diarrhea, + constipation Musculoskeletal: + muscle/joint aches Skin: no rashes, + dry skin Neurological: no tremors, no numbness, no tingling, no dizziness Psychiatric: + depression, no anxiety   Objective:   Objective     BP 114/88 (BP Location: Right Arm, Patient Position: Sitting, Cuff Size: Large)   Pulse (!) 57   Ht 5' 6 (1.676 m)   Wt 219 lb 3.2 oz (99.4 kg)   LMP 02/25/2015   BMI 35.38 kg/m  Wt Readings from Last 3 Encounters:  03/24/24 219 lb 3.2 oz (99.4 kg)  12/22/23 241 lb (109.3 kg)  06/03/23 230 lb (104.3 kg)    BP Readings from Last 3 Encounters:  03/24/24 114/88  12/22/23 115/65  10/01/23 126/83     Constitutional:  Body mass index is 35.38 kg/m., not in acute distress, normal state of mind Eyes: PERRLA, EOMI, no exophthalmos ENT: moist mucous membranes, no thyromegaly or palpable nodularity, no cervical lymphadenopathy Cardiovascular: normal precordial activity, RRR, no murmur/rubs/gallops Respiratory:  adequate breathing efforts, no gross chest deformity, Clear to auscultation bilaterally Gastrointestinal:  abdomen soft, non-tender, no distension, bowel sounds present Musculoskeletal: no gross deformities, strength intact in all four extremities Skin: moist, warm, no rashes Neurological: no tremor with outstretched hands, deep tendon reflexes normal in BLE.   CMP ( most recent) CMP     Component Value Date/Time   NA 139 12/22/2023 1122   K 4.7 12/22/2023 1122   CL 105 12/22/2023 1122   CO2 21 12/22/2023 1122   GLUCOSE 90 12/22/2023 1122   GLUCOSE 104 (H) 04/21/2011 1111   BUN 11 12/22/2023 1122   CREATININE 0.93 12/22/2023 1122   CALCIUM 9.1 12/22/2023 1122   PROT 6.7 12/22/2023 1122   ALBUMIN 4.2 12/22/2023 1122   AST 18 12/22/2023 1122   ALT 22 12/22/2023 1122   ALKPHOS 96 12/22/2023 1122   BILITOT 0.5 12/22/2023 1122   EGFR 74 12/22/2023 1122   GFRNONAA 82 (L) 04/21/2011 1111     Diabetic Labs (most recent): Lab Results  Component Value Date   HGBA1C 5.4 12/22/2023     Lipid Panel ( most recent) Lipid Panel     Component Value Date/Time   CHOL 226 (H) 12/22/2023 1122   TRIG 184 (H) 12/22/2023 1122   HDL 40 12/22/2023 1122   CHOLHDL 5.7 (H) 12/22/2023 1122   LDLCALC 152 (H) 12/22/2023 1122   LABVLDL 34 12/22/2023 1122        Assessment & Plan:   ASSESSMENT / PLAN:  1. Hypothyroidism  Patient with long-standing hypothyroidism, on levothyroxine  therapy. On physical exam, patient does not have gross goiter, thyroid  nodules, or neck compression symptoms.  She is advised to continue her Levothyroxine  125 mcg po daily before breakfast.  Weight based maximum dose should be around 159 mcg.  -  We discussed about correct intake of levothyroxine , at fasting, with water, separated by at least 30 minutes from breakfast, and separated by more than 4 hours from calcium, iron, multivitamins, acid reflux medications (PPIs). -Patient is made aware of the fact that thyroid  hormone replacement is needed for life, dose to be adjusted by periodic monitoring of thyroid  function  tests.  - Will check thyroid  tests in 4 weeks (end of Feb): TSH, free T4 and thyroid  antibodies to help classify her dysfunction.  Will call patient with results and next steps.  -Due to absence of clinical goiter, no need for thyroid  ultrasound.  I did give some information on [employer] lifestyle modifications to assist with sustainable weight loss.  I did caution her about using compound Tirzepatide as it is not FDA approved and may pose more health risks.   - Time spent with the patient: 75 minutes,which was spent in obtaining information about her symptoms, reviewing her previous labs, evaluations, and treatments, counseling her about her hypothyroidism, and developing a plan to confirm the diagnosis and long term treatment as necessary. Please refer to Patient Self Inventory in the Media tab for reviewed elements of pertinent patient history.  Rian LITTIE Furth participated in the discussions, expressed understanding, and voiced agreement with the above plans.  All questions were answered to her satisfaction. she is encouraged to contact clinic should she have any questions or concerns prior to her return visit.   FOLLOW UP PLAN:  Return labs in 4 weeks (end of Feb) will call with results and next steps.SABRA Benton Rio, FNP-BC [facility] [address] [place], [place] [address] Phone: [phone] Fax: [phone]  03/24/2024, 10:35 AM     [1]  Allergies Allergen Reactions   Tomato Other (See Comments)    Cold Sore   "

## 2024-05-18 ENCOUNTER — Ambulatory Visit: Admitting: Nurse Practitioner

## 2024-06-21 ENCOUNTER — Encounter: Admitting: Nurse Practitioner

## 2024-06-27 ENCOUNTER — Encounter: Payer: Self-pay | Admitting: Nurse Practitioner
